# Patient Record
Sex: Male | Born: 1940 | Race: White | Hispanic: No | Smoking: Never smoker
Health system: Southern US, Community
[De-identification: ages and names within clinical notes are randomized; demographics above are authoritative.]

## PROBLEM LIST (undated history)

## (undated) DIAGNOSIS — R9439 Abnormal result of other cardiovascular function study: Secondary | ICD-10-CM

## (undated) DIAGNOSIS — M1990 Unspecified osteoarthritis, unspecified site: Secondary | ICD-10-CM

## (undated) DIAGNOSIS — Z87442 Personal history of urinary calculi: Secondary | ICD-10-CM

## (undated) DIAGNOSIS — I739 Peripheral vascular disease, unspecified: Secondary | ICD-10-CM

## (undated) HISTORY — DX: Abnormal result of other cardiovascular function study: R94.39

## (undated) HISTORY — PX: NECK SURGERY: SHX720

## (undated) HISTORY — PX: CARDIAC CATHETERIZATION: SHX172

---

## 1997-12-25 ENCOUNTER — Ambulatory Visit: Admission: RE | Admit: 1997-12-25 | Discharge: 1997-12-25 | Payer: Self-pay | Admitting: Neurosurgery

## 1998-03-08 ENCOUNTER — Ambulatory Visit (HOSPITAL_COMMUNITY): Admission: RE | Admit: 1998-03-08 | Discharge: 1998-03-08 | Payer: Self-pay | Admitting: Neurosurgery

## 1998-04-25 ENCOUNTER — Ambulatory Visit (HOSPITAL_COMMUNITY): Admission: RE | Admit: 1998-04-25 | Discharge: 1998-04-25 | Payer: Self-pay | Admitting: Gastroenterology

## 2001-06-01 ENCOUNTER — Encounter: Payer: Self-pay | Admitting: Neurosurgery

## 2001-06-01 ENCOUNTER — Inpatient Hospital Stay (HOSPITAL_COMMUNITY): Admission: RE | Admit: 2001-06-01 | Discharge: 2001-06-02 | Payer: Self-pay | Admitting: Neurosurgery

## 2002-07-25 ENCOUNTER — Encounter: Payer: Self-pay | Admitting: Neurosurgery

## 2002-07-25 ENCOUNTER — Ambulatory Visit (HOSPITAL_COMMUNITY): Admission: RE | Admit: 2002-07-25 | Discharge: 2002-07-26 | Payer: Self-pay | Admitting: Neurosurgery

## 2003-02-09 ENCOUNTER — Encounter: Admission: RE | Admit: 2003-02-09 | Discharge: 2003-02-09 | Payer: Self-pay | Admitting: Neurosurgery

## 2003-02-09 ENCOUNTER — Encounter: Payer: Self-pay | Admitting: Neurosurgery

## 2003-02-09 ENCOUNTER — Encounter: Payer: Self-pay | Admitting: Radiology

## 2003-02-23 ENCOUNTER — Encounter: Admission: RE | Admit: 2003-02-23 | Discharge: 2003-02-23 | Payer: Self-pay | Admitting: Neurosurgery

## 2003-02-23 ENCOUNTER — Encounter: Payer: Self-pay | Admitting: Neurosurgery

## 2003-03-01 ENCOUNTER — Encounter: Payer: Self-pay | Admitting: Neurosurgery

## 2003-03-01 ENCOUNTER — Inpatient Hospital Stay (HOSPITAL_COMMUNITY): Admission: RE | Admit: 2003-03-01 | Discharge: 2003-03-03 | Payer: Self-pay | Admitting: Neurosurgery

## 2003-04-06 ENCOUNTER — Encounter: Admission: RE | Admit: 2003-04-06 | Discharge: 2003-04-06 | Payer: Self-pay | Admitting: Neurosurgery

## 2003-04-06 ENCOUNTER — Encounter: Payer: Self-pay | Admitting: Neurosurgery

## 2003-06-09 ENCOUNTER — Encounter: Payer: Self-pay | Admitting: Neurosurgery

## 2003-06-09 ENCOUNTER — Encounter: Admission: RE | Admit: 2003-06-09 | Discharge: 2003-06-09 | Payer: Self-pay | Admitting: Neurosurgery

## 2003-07-31 ENCOUNTER — Emergency Department (HOSPITAL_COMMUNITY): Admission: EM | Admit: 2003-07-31 | Discharge: 2003-07-31 | Payer: Self-pay | Admitting: Emergency Medicine

## 2003-09-01 ENCOUNTER — Ambulatory Visit (HOSPITAL_COMMUNITY): Admission: RE | Admit: 2003-09-01 | Discharge: 2003-09-01 | Payer: Self-pay | Admitting: Gastroenterology

## 2003-11-14 ENCOUNTER — Ambulatory Visit (HOSPITAL_COMMUNITY): Admission: RE | Admit: 2003-11-14 | Discharge: 2003-11-14 | Payer: Self-pay | Admitting: Orthopaedic Surgery

## 2003-11-14 ENCOUNTER — Ambulatory Visit (HOSPITAL_BASED_OUTPATIENT_CLINIC_OR_DEPARTMENT_OTHER): Admission: RE | Admit: 2003-11-14 | Discharge: 2003-11-14 | Payer: Self-pay | Admitting: Orthopaedic Surgery

## 2008-06-21 ENCOUNTER — Encounter: Admission: RE | Admit: 2008-06-21 | Discharge: 2008-06-21 | Payer: Self-pay | Admitting: Family Medicine

## 2008-08-01 ENCOUNTER — Encounter: Admission: RE | Admit: 2008-08-01 | Discharge: 2008-08-01 | Payer: Self-pay | Admitting: Interventional Radiology

## 2008-08-08 ENCOUNTER — Encounter: Admission: RE | Admit: 2008-08-08 | Discharge: 2008-08-08 | Payer: Self-pay | Admitting: Interventional Radiology

## 2008-08-29 ENCOUNTER — Encounter: Admission: RE | Admit: 2008-08-29 | Discharge: 2008-08-29 | Payer: Self-pay | Admitting: Interventional Radiology

## 2009-01-24 ENCOUNTER — Encounter: Admission: RE | Admit: 2009-01-24 | Discharge: 2009-01-24 | Payer: Self-pay | Admitting: Interventional Radiology

## 2010-04-29 IMAGING — US IR TRANSCATH EMBOLIZATION NON-NEURO EA OP FIELD
1 series · 6 of 6 positions shown · non-contrast
Comparison: none

CLINICAL DATA: RT LEG EVLT OF SSV AND POSSIBLE SCLERO. .

[Series 1: ir transcath embolization non-neuro ea op field · 0.06mm/px · 6 acquisitions, 6 frames shown]
[im 1/6]
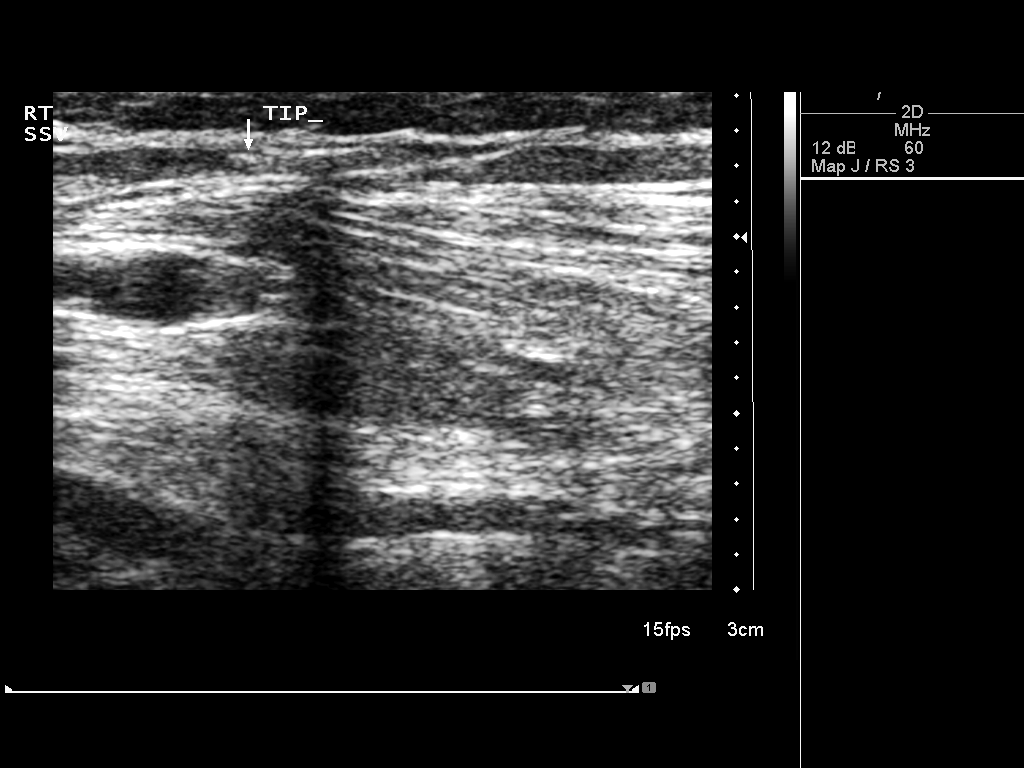
[im 2/6]
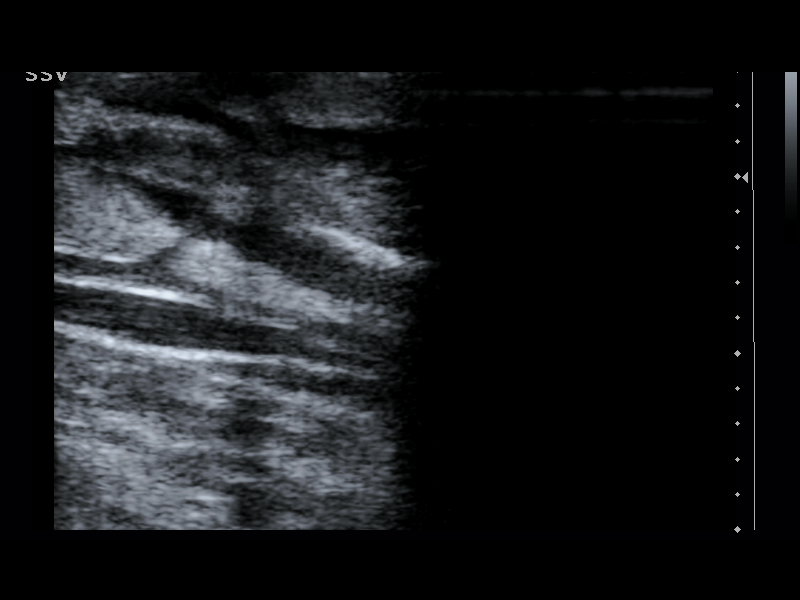
[im 3/6]
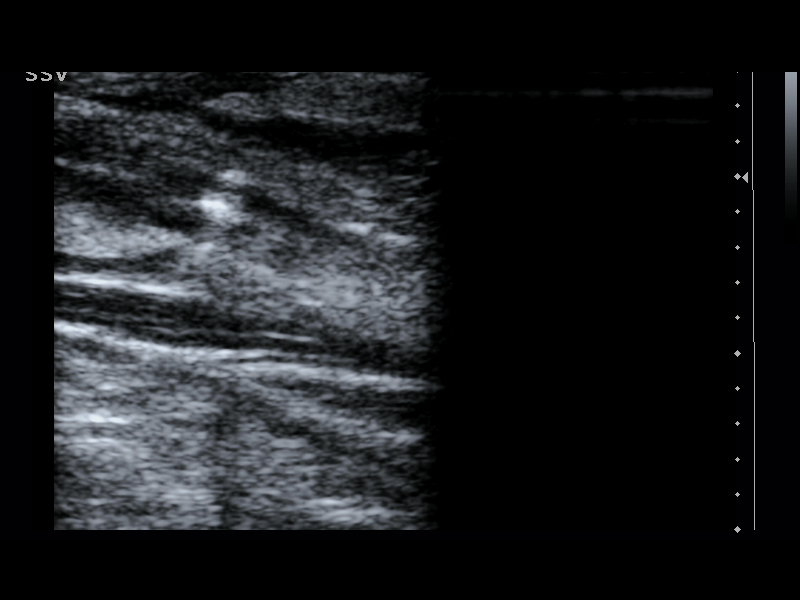
[im 4/6]
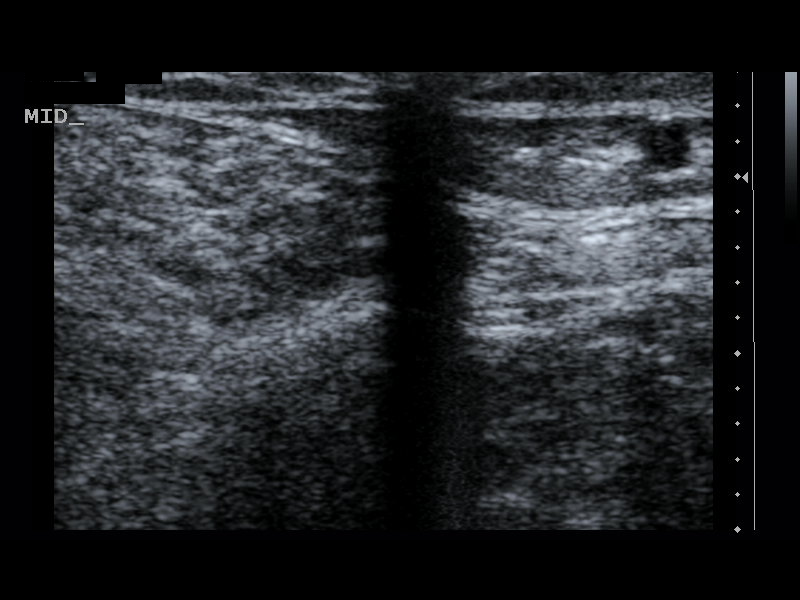
[im 5/6]
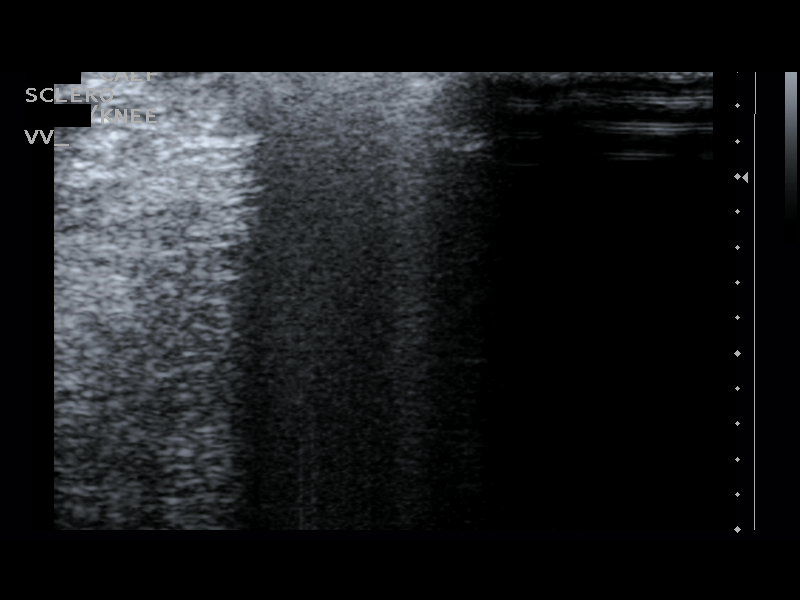
[im 6/6]
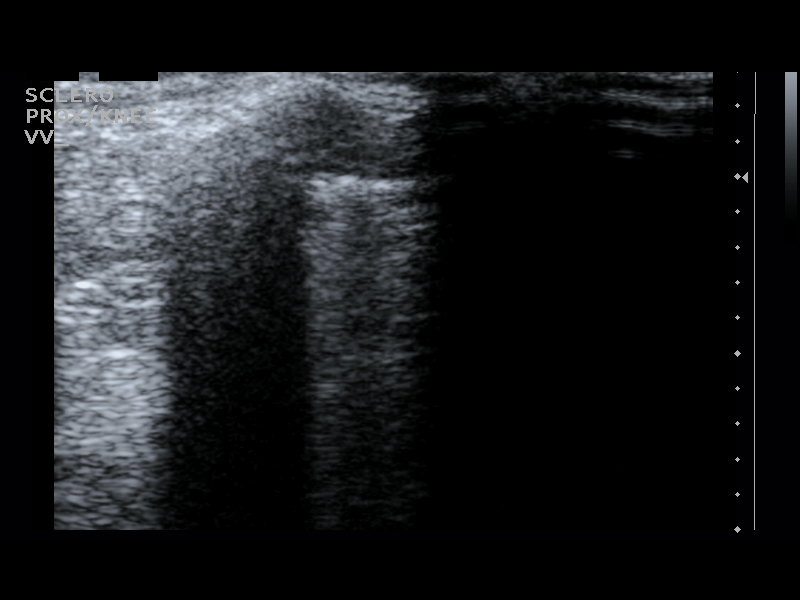

[6 of 6 positions shown; findings below may reference images not displayed]

TRANSCATHETER ENDOVENOUS LASER OCCLUSION OF THE STIR SAPHENOUS VEIN
UNDER ULTRASOUND GUIDANCE

Sedation: Valium 5 mg orally.

Procedure time: 60 minutes.

Tumescent: 100 ml

Laser: 600 micron, 10 Valter power

Access: Right calf

Catheter tip: Saphenopopliteal junction

Length of segment treated: 8 cm

Pull back time: 41 seconds

Total energy: 374 joules

Procedure:The procedure, risks, benefits, and alternatives were
explained to the patient. Questions regarding the procedure were
encouraged and answered. The patient understands and consents to
the procedure.

Initial mapping of the right lower extremity was performed and
course of the great saphenous vein mapped including level of the
saphenofemoral junction and major tributaries supplying
varicosities.

The right calf was prepped withbetadine in a sterile fasion, and a
sterile split drape was applied covering the operative field. A
sterile gown and sterile gloves were used for the procedure.

Under sonographic guidance, a micropuncture needle was inserted
into the right lesser saphenous vein at the level of mid calf,
which was noted to be patent.  It was removed over and 018 wire
which was upsized to an 035 J- wire utilizing the micropuncture
set. This was advanced across the saphenopopliteal junction into
the deep venous system.  A 4-French sheath was inserted over the
wire to the saphenofemoral junction.  The [employer] 600 micron
laser was inserted into the sheath, and was positioned with its tip
2.0 cm from the saphenofemoral junction.

Tumescent anesthesia was instilled along the lesser saphenous vein
providing a 1 cm buffer.  Prior to laser occlusion, distance from
the tip of the laser fiber to the saphenofemoral junction was
remeasured. Laser mediated occlusion was then performed. The sheath
was then removed. A compressive dressing was applied.  A 20-30 mm
Hg graded compression stocking was immediately applied post
procedure.  The patient ambulated prior to discharge.]
FINDINGS: Prior to laser treatment, the laser fiber distance from
the saphenopopliteal junction was documented to be 2 cm.

Complications: None
IMPRESSION: Successful endovenous laser occlusion of the lesser saphenous vein
to treat symptomatic venous insufficiency.

## 2010-05-06 IMAGING — US US EXTREM LOW VENOUS*R*
1 series · 13 of 24 positions shown · non-contrast
Comparison: 06/21/2008

CLINICAL DATA: 1 week status post right lesser saphenous vein
occlusion



[Series 1: us extrem low venous*right* · 13 of 28 slices shown]
[im 1/28]
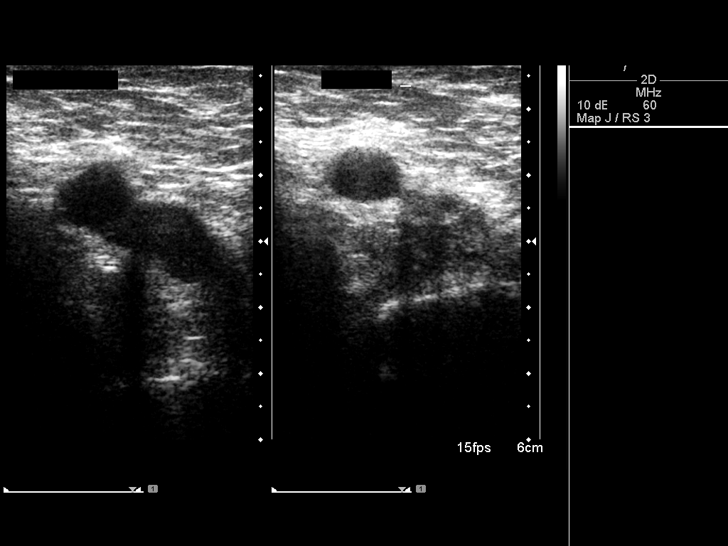
[im 3/28]
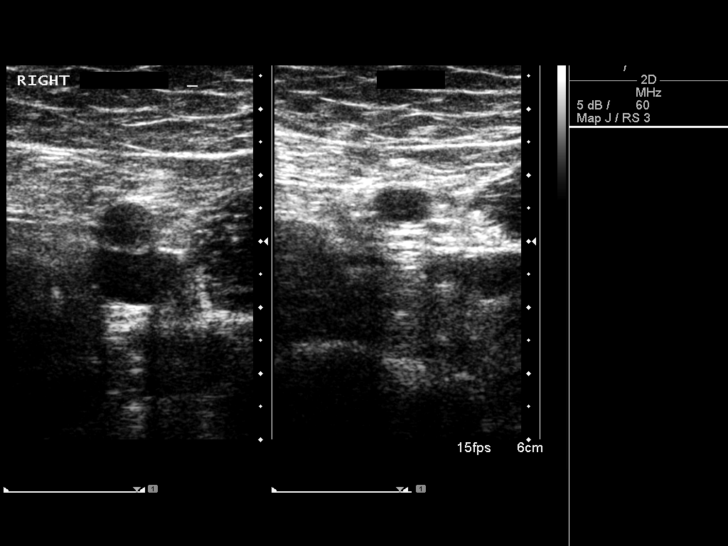
[im 5/28]
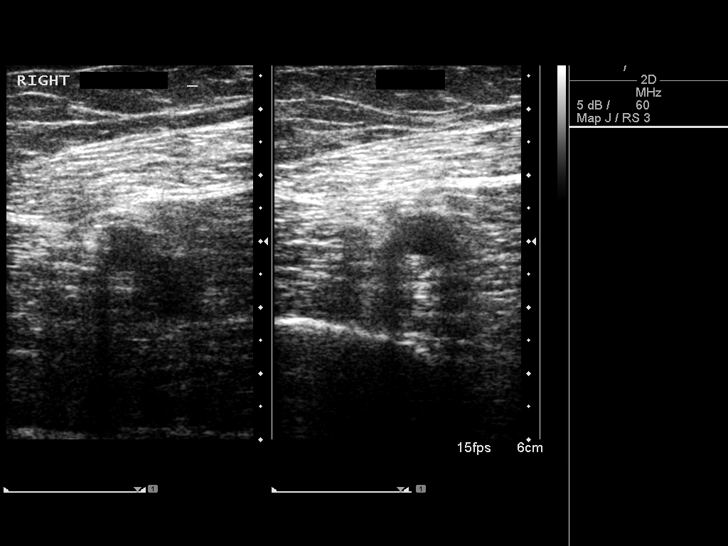
[im 8/28]
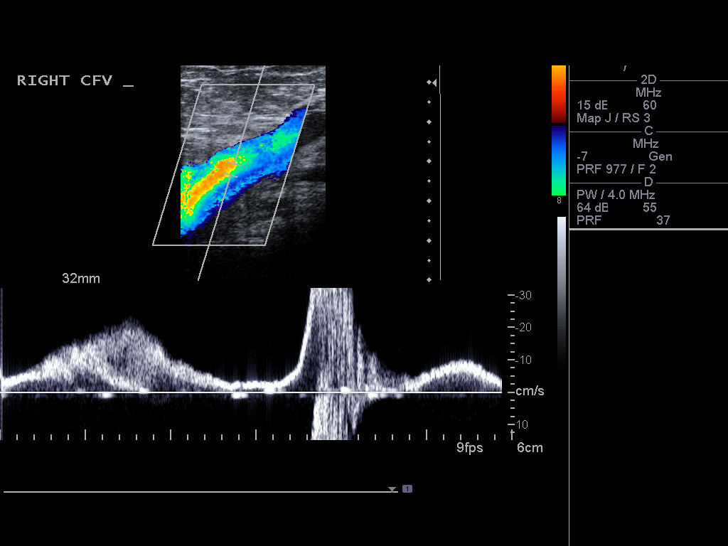
[im 10/28]
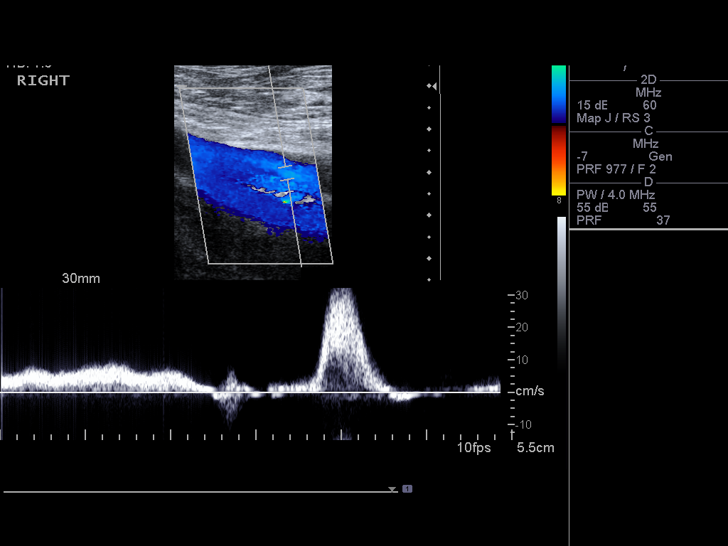
[im 12/28]
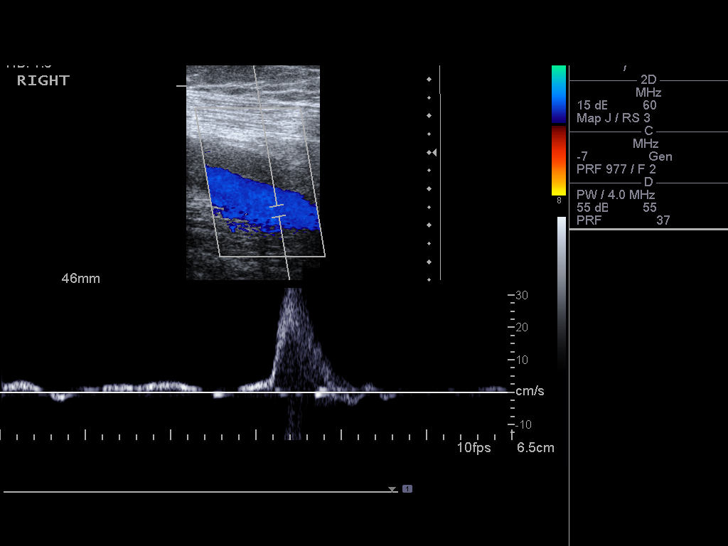
[im 15/28]
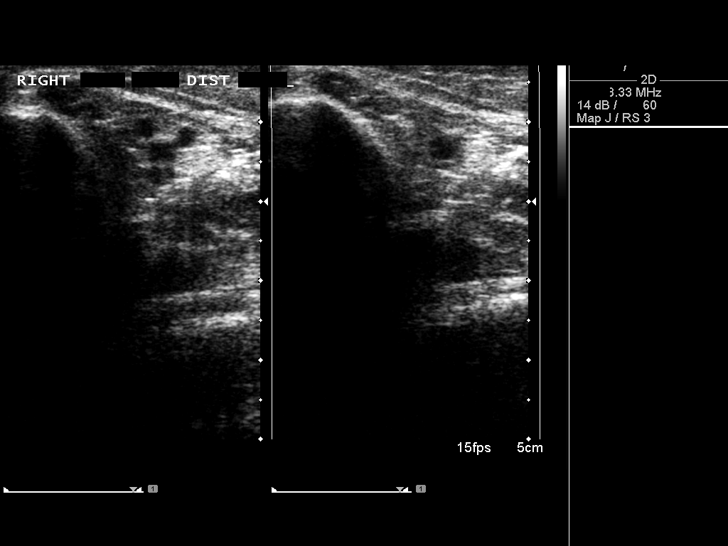
[im 16/28]
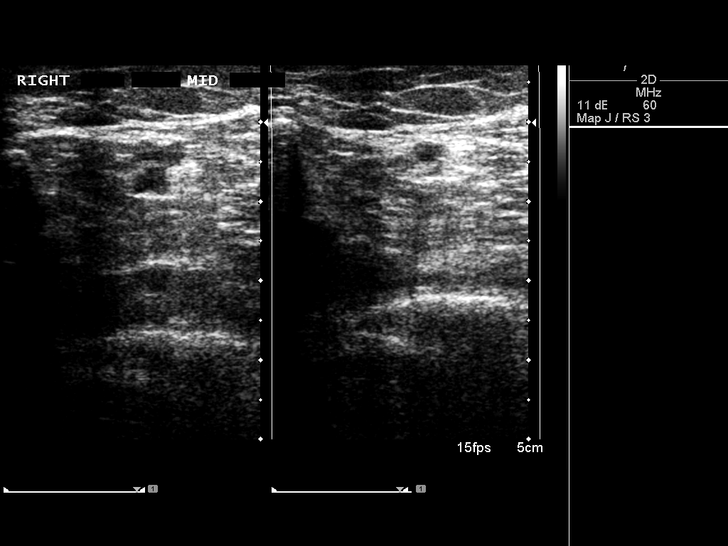
[im 18/28]
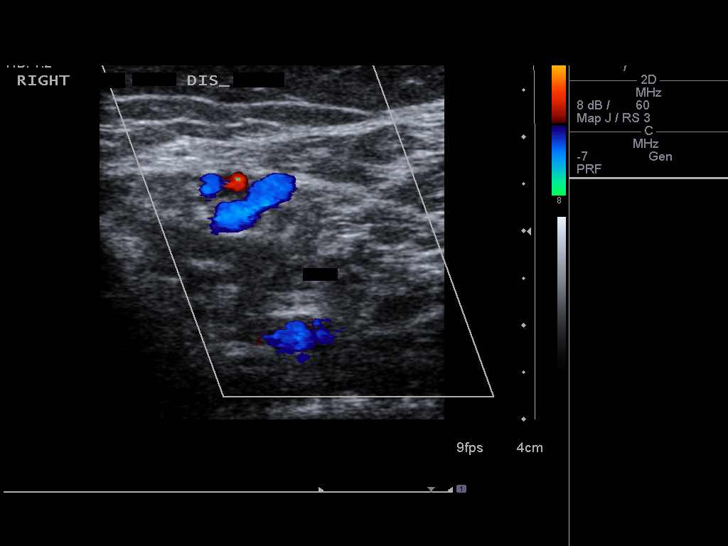
[im 20/28]
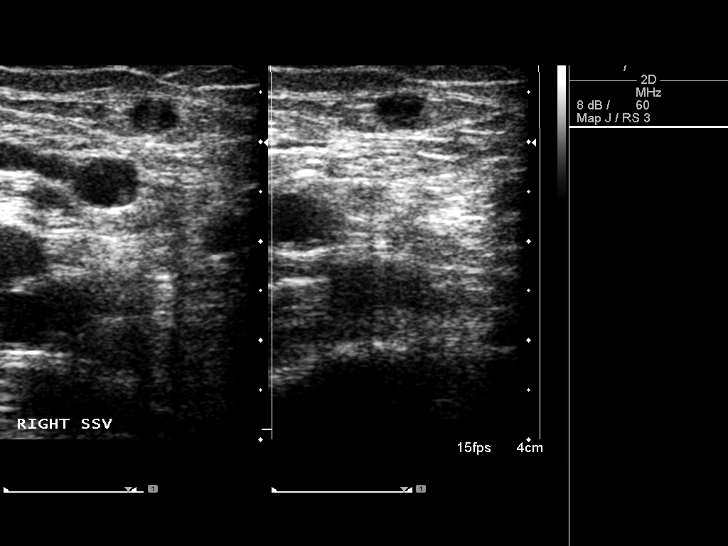
[im 23/28]
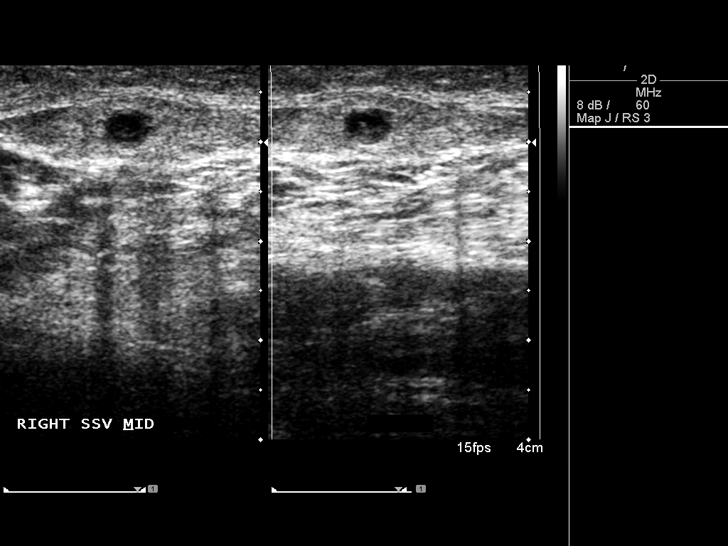
[im 25/28]
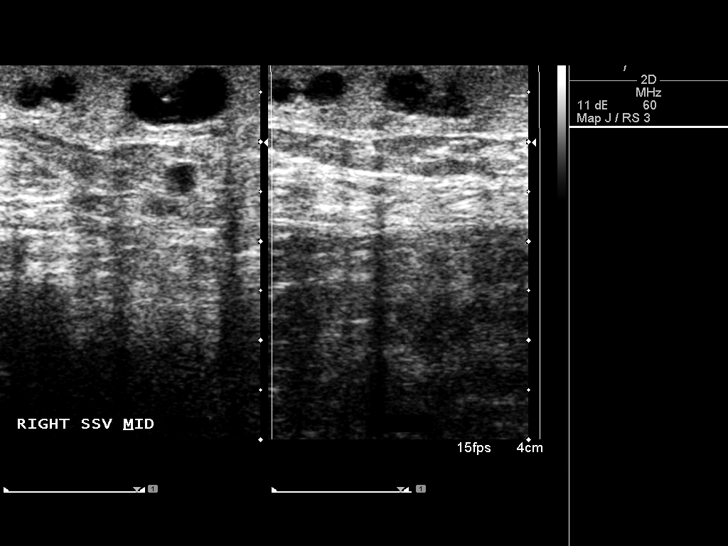
[im 28/28]
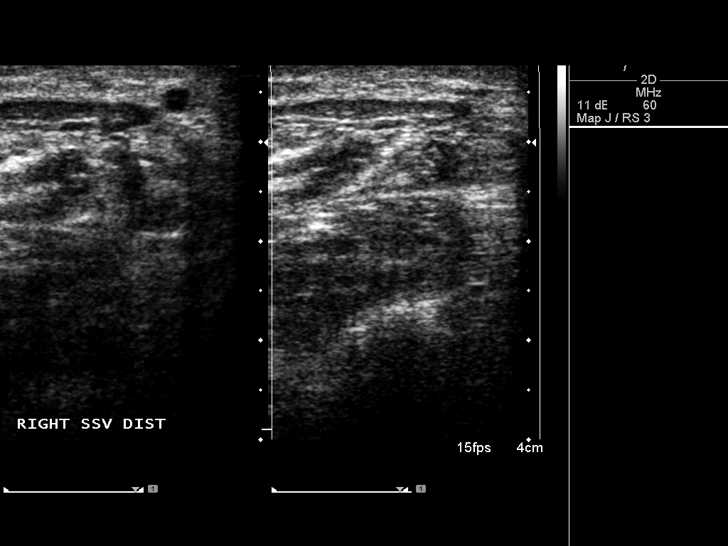

[13 of 24 positions shown; findings below may reference images not displayed]

FINDINGS: There is complete compressibility of the right common
femoral, femoral, and popliteal veins.  Doppler analysis
demonstrates respiratory phasicity and augmentation of flow upon
calf compression.

The right lesser saphenous vein is occluded.  The most inferior
varicosities has also thrombosed.  There is a more superior
varicosities leading to a perforator.  The varicosities partially
thrombosed.  The perforator is partially thrombosed.
IMPRESSION: No DVT.

Successful occlusion of the lesser saphenous vein.

There is partial occlusion of one of the varicosities leading to a
perforator.  The patient was instructed to continue light activity
for 3 weeks.  He will return at that time for reevaluation.

## 2011-02-14 NOTE — Op Note (Signed)
NAME:  Vincent Mcbride, Vincent Mcbride                         ACCOUNT NO.:  [id number]   MEDICAL RECORD NO.:  [id number]                   PATIENT TYPE:  INP   LOCATION:  NA                                   FACILITY:  [facility]   PHYSICIAN:  Henry A. Pool, M.D.                 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   OPERATION PERFORMED:  Re-exploration of L5-S1 laminotomy, bilateral with  bilateral redo microdiskectomies.  L5-S1 posterior lumbar interbody fusion  utilizing tangent wedges and local autograft.  L5-S1 posterolateral fusion  utilizing pedicle screw instrumentation and local autograft.   SURGEON:  Kathaleen Maser. Pool, M.D.   ASSISTANT:  Donalee Citrin, M.D.   ANESTHESIA:  General endotracheal.   INDICATIONS FOR PROCEDURE:  Mr. Mczeal is a 70 year old male who is status  post two previous right-sided L5-S1 laminotomies and microdiskectomies.  The  patient has persistent right lower extremity pain and associated back pain  which has failed conservative management.  MRI scanning demonstrates  evidence of a recurrent/residual right-sided L5-S1 disk herniation with  compression of the right-sided S1 nerve root.  We have discussed options  available for management.  I have recommended that he undergo re-exploration  of his laminotomy with bilateral redo microdiskectomies followed by  posterior lumbar interbody fusion with tangent wedges and local autograft  couplet with posterolateral fusion utilizing pedicle screw fixation and  local autograft.  The patient is aware of the risks and benefits and wishes  to proceed.   DESCRIPTION OF PROCEDURE:  The patient was taken to the operating room and  placed on the table  in the supine position.  After adequate level of  anesthesia was achieved, the patient was positioned prone onto a Wilson  frame and appropriately padded.  The patient's lumbar region was prepped and  draped sterilely.  A 10 blade was used to make a linear skin incision  overlying the L4, L5 and S1 levels.  This was carried down sharply in the  midline.  A subperiosteal dissection was then performed exposing the lamina  and facet joints of L4, L5 and S1 as well as transverse processes of L5 and  the sacral ala.  Deep self-retaining retractor was placed.  Intraoperative  fluoroscopy was used, the level was confirmed.  The patient's previous  laminotomy site on the right at L5-S1 was dissected free with dental  instruments.  A complete laminectomy of L5 was then performed using Leksell  rongeurs, Kerrison rongeurs and a high speed drill to remove the entire  lamina at L5 and the entire inferior facet complex at L5 bilaterally.  Superior facetectomies at L1 were then performed bilaterally.  Ligamentum  flavum was then elevated and resected in piecemeal fashion using Kerrison  rongeurs.  The patient's epidural scar was mobilized and resected.  The  exiting L5 and S1 nerve roots were identified.  Wide foraminotomies were  then performed along the course of the exiting nerve roots.  Epidural scar  along the lateral wall was then dissected free using dental instruments.  A  moderate amount of recurrent disk herniation was encountered just beneath  the right-sided S1 nerve root. This was resected completely.  The thecal sac  and S1 nerve root were then mobilized and retracted towards the midline  using a D'Errico nerve root retractor.  The disk space was then isolated and  incised with a 15 blade in rectangular fashion.  A wide disk space clean out  was then achieved pituitary rongeurs and upward angled pituitary rongeurs  and Epstein curets.  All loose or obviously degenerated disk material was   removed from the interspace. The disk space was then distracted up to 10 mm.  Attention was then placed to the patient's left side. The thecal sac and  nerve roots were protected on the left.  The disk space was once again  incised with a 15 blade and an aggressive diskectomy was performed.  With a  10 mm distractor left in the patient's right side and the thecal sac and  nerve root protected on the left, the disk space was then reamed with a 10  mm box cutter and then cut with a 10 mm tangent chisel. Soft tissues were  removed from the interspace and a 10 x 26 mm tangent wedge was then impacted  into place and recessed approximately 1 mm from the posterior cortical  margin.  The distractor was removed from the patient's right side.  Thecal  sac and nerve roots were then protected on the right.  Disk space was then  reamed and then cut with a 10 mm tangent chisel.  Soft tissue was removed  from the interspace.  The disk space was further curettaged.  Morselized  autograft was then packed into the interspace.  A second 10 x 26 mm tangent  wedge was then packed into place and recessed approximately 1 mm from the  posterior cortical margin.  Pedicles at L5 and S1 were then isolated using  surface landmarks and intraoperative fluoroscopy.  Each pedicle was then  entered by first removing superficial bone using high speed drill.  Each  pedicle was then probed using a pedicle awl.  Each pedicle awl track was  found to be solidly within bone.  Each pedicle awl track was then tapped  with a 5.25 mm screw tap.  Each screw tap hole was found to be solidly  within bone.  A 6.75 x 45 mm spiral 90 screw was then placed bilaterally at  L5.  A 6.75 x 40 mm screws were placed bilaterally at S1.  Transverse  processes and sacral ala were then decorticated using a high speed drill.  The wound was then irrigated with antibiotic solution.  Morselized autograft was then packed posterolaterally for later fusion.   A short segment of  titanium rod was then contoured and placed over the screw heads at L5 and  S1.  Locking caps were then placed over the screw heads. These caps were  then engaged in sequential fashion with the construct under compression.  Final images revealed good position of bone graft and hardware at the proper  operative level with normal  alignment of the spine.  The wound was then  irrigated one final time.  Gelfoam was placed topically for hemostasis which  was found to be good.  A medium Hemovac drain was left in the epidural  space.  The wound was then closed in layers with Vicryl sutures.  Steri-  Strips and sterile dressing were applied.  There were no apparent  complications.  The patient tolerated the procedure well and returned to the  recovery room postoperatively.                                               Henry A. Pool, M.D.    HAP/MEDQ  D:  03/01/2003  T:  03/01/2003  Job:  [id number]

## 2011-02-14 NOTE — Op Note (Signed)
NAME:  Vincent Mcbride, Vincent Mcbride                         ACCOUNT NO.:  [id number]   MEDICAL RECORD NO.:  [id number]                   PATIENT TYPE:  OIB   LOCATION:  2867                                 FACILITY:  [facility]   PHYSICIAN:  Hewitt Shorts, M.D.            DATE OF BIRTH:  [birth year]   DATE OF PROCEDURE:  07/25/2002  DATE OF DISCHARGE:                                 OPERATIVE REPORT   PREOPERATIVE DIAGNOSIS:  Recurrent right L5-S1 lumbar disc herniation.   POSTOPERATIVE DIAGNOSIS:  Recurrent right L5-S1 lumbar disc herniation.   OPERATION:  Right L5-S1 lumbar laminotomy and microdiskectomy.   SURGEON:  Hewitt Shorts, M.D.   ASSISTANT:  Eber Hong, P.A.   ANESTHESIA:  General endotracheal.   INDICATIONS FOR PROCEDURE:  The patient is a 70 year old man who presented  with right lumbar radiculopathy.  He is about 14 months status post a  previous right L5-S1 lumbar laminotomy and microdiskectomy.  MRI revealed  recurrent disc herniation.  Decision to proceed with elective laminotomy and  microdiskectomy.   DESCRIPTION OF PROCEDURE:  The patient was brought to the operating room and  placed under general endotracheal anesthesia.  The patient was turned to a  prone position.  Lumbar region was prepped with Betadine soap and solution  and draped in a sterile fashion.  A midline incision was treated with local  anesthetic with epinephrine and an x-ray was taken to localize the L5-S1  level.  A midline incision was made and carried down through the  subcutaneous tissue.  Bipolar cautery and electrocautery used to maintain  hemostasis.  Dissection was carried down to the lumbar fascia which was  incised on the right side of the midline and the paraspinal muscles were  dissected from the spinous process and lamina in a subperiosteal fashion.  There was moderate scarring from his previous surgery.  The microscope was  draped and brought into the field to provide  instrument magnification,  illumination and visualization and the remainder of the procedure was  performed using microdissection and microsurgical technique.  The scar  tissue within the previous laminotomy device was carefully dissected.  An x-  ray was taken to confirm our localization at the L5-S1 level and then the  laminotomy was extended both rostrally and caudally using the [facility] Max  drill and Kerrison punches.  We were able to identify the thecal sac and  right S1 nerve root and gradually we were able to mobilize these structures  medially.  The annulus of the disc was identified and the annulus was  incised.  We entered the disc space and proceeded with diskectomy using a  variety of pituitary rongeurs.  We then examined the area beneath the right  S1 nerve root that was somewhat scarred down.  Several fragments of disc  herniation free within the scar tissue and directing compressing the right  S1 nerve root were identified  and mobilized and removed.  We were able to  free up the right S1 nerve root and it was no longer compressed.  We  proceeded with diskectomy using variety of pituitary rongeurs removing all  loose fragments of disc material from both the disc space and epidural space  and once the diskectomy was completed and all loose fragments of disc  material were removed, we established hemostasis with the use of bipolar  cautery and then we inserted 2 cc of Fentanyl and 80 mg of Depo-Medrol into  the epidural space and then proceeded with closure.  The deep fascia was  closed with interrupted undyed 0 Vicryl sutures, the subcutaneous was closed  with interrupted inverted 2-0 undyed Vicryl suture.  The skin was  reapproximated with Dermabond.  The patient tolerated the  procedure well.  The estimated blood loss was 10 cc.  Sponge and needle  count was correct.  Following surgery, the patient was turned back to a  supine position, anesthesia reversed, extubated and  transferred to the  recovery room for further care.                                               Hewitt Shorts, M.D.    RWN/MEDQ  D:  07/25/2002  T:  07/25/2002  Job:  [id number]
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
## 2011-02-14 NOTE — Op Note (Signed)
[facility]  Patient:    Vincent Mcbride, Vincent Mcbride Visit Number: [id number] MRN: [id number]          Service Type: SUR Location: [id number] Attending Physician:  Barton Fanny Dictated by:   Hewitt Shorts, M.D. Proc. Date: 06/01/01 Admit Date:  06/01/2001                             Operative Report  PREOPERATIVE DIAGNOSIS:  Right L5-S1 lumbar disk herniation.  POSTOPERATIVE DIAGNOSIS:  Right L5-S1 lumbar disk herniation.  OPERATION PERFORMED:  Right L5-S1 lumbar laminotomy and microendoscopic diskectomy.  SURGEON:  Hewitt Shorts, M.D.  ASSISTANT:  Tanya Nones. Jeral Fruit, M.D.  ANESTHESIA:  General endotracheal.  INDICATIONS FOR PROCEDURE:  The patient is a 70 year old man who presented with a cute right lumbar radiculopathy and was found to have a right L5-S1 lumbar disk herniation that migrated caudally behind the body of S1.  Decision was made to proceed with elective laminotomy and microendoscopic diskectomy.  DESCRIPTION OF PROCEDURE:  The patient was brought to the operating room and placed under general endotracheal anesthesia.  The patient was turned to a prone position.  Lumbar region was prepped with Betadine soap and solution and draped in a sterile fashion.  Using C-arm fluoroscopy we identified the skin surface over the L5-S1 interlaminar space.  An incision was made proximally 2.5 cm to the right of the midline.  The line of the incision was infiltrated with local anesthetic with epinephrine.  A k-wire was passed down to the S1 lamina and then a series of dilators was passed over the K-wire.  We were able to dissect the superficial tissues overlying the lamina and interlaminar space and then a 6 cm tube retractor was placed and locked in position.  The microscope was draped and brought into the field to provide additional magnification, illumination and visualization.  The remainder of the procedure was performed  using microdissection and microsurgical technique.  Superficial soft tissue on the laminar surface was removed and the interlaminar space identified.  Superior aspect of S1 was removed using the [employer] drill and Kerrison punches and a medial facetectomy was performed and the ligamentum flavum was removed.  We identified the thecal sac and right S1 nerve root.  We attempted to retract the nerve root medially but it was difficult to do so. We therefore extended the S1 laminotomy medially and exposed the axilla of the nerve root.  Within the axilla was found a large disk fragment.  This was removed in a piecemeal fashion.  Portions of the disk were adherent to the nerve root.  We removed the mass and the great majority of the disk material but that portion that was directly adherent to the nerve root and could not be dissected away without straining and stretching the nerve root was left; however, the nerve root was clearly decompressed.  We did not enter the disk space.  All loose fragments of disk material that were loose within the epidural space were removed and the nerve root was decompressed as was the thecal sac.  We irrigated the wound with bacitracin solution, checked for hemostasis which was established with the use of bipolar cautery as well as Gelfoam soaked in thrombin.  All the Gelfoam was removed prior to closure. Once hemostasis was established, we instilled 2 cc of fentanyl and 80 mg of Depo-Medrol into the epidural space and then proceeded with  closure.  The subcutaneous and subcuticular layer were closed with interrupted inverted 3-0 undyed Vicryl sutures and the skin was closed with Dermabond.  The patient tolerated the procedure well.  Estimated blood loss for this procedure was less than 25 cc.  Sponge, needle and instrument counts were correct. Following surgery, the patient was turned back to supine position to be reversed from anesthetic, extubated and transferred to  recovery for further care. Dictated by:   Hewitt Shorts, M.D. Attending Physician:  Barton Fanny DD:  06/01/01 TD:  06/01/01 Job: [id number]

## 2011-02-14 NOTE — Op Note (Signed)
NAME:  Vincent Mcbride, Vincent Mcbride                         ACCOUNT NO.:  [id number]   MEDICAL RECORD NO.:  [id number]                   PATIENT TYPE:  AMB   LOCATION:  ENDO                                 FACILITY:  [facility]   PHYSICIAN:  Bernette Redbird, M.D.                DATE OF BIRTH:  1941/06/03   DATE OF PROCEDURE:  09/01/2003  DATE OF DISCHARGE:                                 OPERATIVE REPORT   PROCEDURE:  Colonoscopy.   INDICATION:  Followup of previous small colonic adenoma with negative  colonoscopy for surveillance 5 years ago.   FINDINGS:  Sigmoid diverticulosis.   PROCEDURE:  The nature, purpose, and risks of the procedure had been  discussed with the patient who provided written consent.   The digital exam of the prostate was unremarkable.  Sedation was Fentanyl 40  mcg and Versed 4 mg without arrhythmias or desaturation.   The Olympus adult video-colonoscope was advanced to the terminal ileum  without difficulty using a little bit of external abdominal compression and  controlled looping.  The terminal ileum had a normal appearance and pullback  was then performed.   The quality of the prep was excellent and it was felt that all areas were  well seen.   There was moderate sigmoid diverticulosis, but this was otherwise a normal  examine without evidence of polyps, cancer, vascular malformations, or  colitis.  Retroflexion in the rectum was unremarkable.  No biopsies were  obtained.  The patient tolerated the procedure well and there were no  apparent complications.   IMPRESSION:  Sigmoid diverticulosis, otherwise normal surveillance  colonoscopy.   PLAN:  Followup colonoscopy in 5 years.                                               Bernette Redbird, M.D.    RB/MEDQ  D:  09/01/2003  T:  09/01/2003  Job:  [id number]   cc:   Al Decant. Janey Greaser, MD  [address]  [place]  [place] [address]  Fax: [phone]

## 2011-12-31 DIAGNOSIS — M2559 Pain in other specified joint: Secondary | ICD-10-CM | POA: Diagnosis not present

## 2011-12-31 DIAGNOSIS — M999 Biomechanical lesion, unspecified: Secondary | ICD-10-CM | POA: Diagnosis not present

## 2012-02-24 DIAGNOSIS — N400 Enlarged prostate without lower urinary tract symptoms: Secondary | ICD-10-CM | POA: Diagnosis not present

## 2012-03-01 DIAGNOSIS — N419 Inflammatory disease of prostate, unspecified: Secondary | ICD-10-CM | POA: Diagnosis not present

## 2012-03-01 DIAGNOSIS — N200 Calculus of kidney: Secondary | ICD-10-CM | POA: Diagnosis not present

## 2012-03-01 DIAGNOSIS — N402 Nodular prostate without lower urinary tract symptoms: Secondary | ICD-10-CM | POA: Diagnosis not present

## 2012-03-01 DIAGNOSIS — N401 Enlarged prostate with lower urinary tract symptoms: Secondary | ICD-10-CM | POA: Diagnosis not present

## 2012-03-01 DIAGNOSIS — N138 Other obstructive and reflux uropathy: Secondary | ICD-10-CM | POA: Diagnosis not present

## 2012-04-26 DIAGNOSIS — M999 Biomechanical lesion, unspecified: Secondary | ICD-10-CM | POA: Diagnosis not present

## 2012-04-26 DIAGNOSIS — M2559 Pain in other specified joint: Secondary | ICD-10-CM | POA: Diagnosis not present

## 2012-05-19 DIAGNOSIS — T1490XA Injury, unspecified, initial encounter: Secondary | ICD-10-CM | POA: Diagnosis not present

## 2012-05-19 DIAGNOSIS — R071 Chest pain on breathing: Secondary | ICD-10-CM | POA: Diagnosis not present

## 2012-07-28 DIAGNOSIS — Z23 Encounter for immunization: Secondary | ICD-10-CM | POA: Diagnosis not present

## 2012-08-02 DIAGNOSIS — L821 Other seborrheic keratosis: Secondary | ICD-10-CM | POA: Diagnosis not present

## 2012-08-02 DIAGNOSIS — D235 Other benign neoplasm of skin of trunk: Secondary | ICD-10-CM | POA: Diagnosis not present

## 2012-08-02 DIAGNOSIS — D485 Neoplasm of uncertain behavior of skin: Secondary | ICD-10-CM | POA: Diagnosis not present

## 2012-08-02 DIAGNOSIS — D1801 Hemangioma of skin and subcutaneous tissue: Secondary | ICD-10-CM | POA: Diagnosis not present

## 2012-08-31 DIAGNOSIS — Z131 Encounter for screening for diabetes mellitus: Secondary | ICD-10-CM | POA: Diagnosis not present

## 2012-08-31 DIAGNOSIS — Z136 Encounter for screening for cardiovascular disorders: Secondary | ICD-10-CM | POA: Diagnosis not present

## 2012-08-31 DIAGNOSIS — Z0000 Encounter for general adult medical examination without abnormal findings: Secondary | ICD-10-CM | POA: Diagnosis not present

## 2012-08-31 DIAGNOSIS — N529 Male erectile dysfunction, unspecified: Secondary | ICD-10-CM | POA: Diagnosis not present

## 2012-08-31 DIAGNOSIS — Z125 Encounter for screening for malignant neoplasm of prostate: Secondary | ICD-10-CM | POA: Diagnosis not present

## 2012-11-02 DIAGNOSIS — L905 Scar conditions and fibrosis of skin: Secondary | ICD-10-CM | POA: Diagnosis not present

## 2013-03-29 DIAGNOSIS — N138 Other obstructive and reflux uropathy: Secondary | ICD-10-CM | POA: Diagnosis not present

## 2013-03-29 DIAGNOSIS — N401 Enlarged prostate with lower urinary tract symptoms: Secondary | ICD-10-CM | POA: Diagnosis not present

## 2013-04-05 DIAGNOSIS — N200 Calculus of kidney: Secondary | ICD-10-CM | POA: Diagnosis not present

## 2013-04-22 DIAGNOSIS — I8000 Phlebitis and thrombophlebitis of superficial vessels of unspecified lower extremity: Secondary | ICD-10-CM | POA: Diagnosis not present

## 2013-04-25 ENCOUNTER — Ambulatory Visit
Admission: RE | Admit: 2013-04-25 | Discharge: 2013-04-25 | Disposition: A | Discharge status: Home / Self Care | Payer: Medicare Other | Source: Ambulatory Visit | Attending: Family Medicine | Admitting: Family Medicine

## 2013-04-25 ENCOUNTER — Other Ambulatory Visit: Payer: Self-pay | Admitting: Family Medicine

## 2013-04-25 DIAGNOSIS — R609 Edema, unspecified: Secondary | ICD-10-CM

## 2013-04-25 DIAGNOSIS — I83893 Varicose veins of bilateral lower extremities with other complications: Secondary | ICD-10-CM | POA: Diagnosis not present

## 2013-05-02 ENCOUNTER — Other Ambulatory Visit: Payer: Self-pay | Admitting: Family Medicine

## 2013-05-02 DIAGNOSIS — I83811 Varicose veins of right lower extremities with pain: Secondary | ICD-10-CM

## 2013-05-04 ENCOUNTER — Ambulatory Visit
Admission: RE | Admit: 2013-05-04 | Discharge: 2013-05-04 | Disposition: A | Discharge status: Home / Self Care | Payer: Medicare Other | Source: Ambulatory Visit | Attending: Family Medicine | Admitting: Family Medicine

## 2013-05-04 DIAGNOSIS — I83811 Varicose veins of right lower extremities with pain: Secondary | ICD-10-CM

## 2013-05-04 DIAGNOSIS — I83893 Varicose veins of bilateral lower extremities with other complications: Secondary | ICD-10-CM | POA: Diagnosis not present

## 2013-05-19 ENCOUNTER — Telehealth: Payer: Self-pay | Admitting: Radiology

## 2013-05-19 NOTE — Telephone Encounter (Signed)
Contacted patient to discuss insurance approval for sclerotherapy.    Patient states that he prefers to schedule Rx for October 2014.  Will contact patient to schedule when October IR Rad schedule has been confirmed.  Delyle Weider Carmell Austria, RN 05/19/2013 2:01 PM

## 2013-06-13 DIAGNOSIS — H43819 Vitreous degeneration, unspecified eye: Secondary | ICD-10-CM | POA: Diagnosis not present

## 2013-06-15 ENCOUNTER — Other Ambulatory Visit (HOSPITAL_COMMUNITY): Payer: Self-pay | Admitting: Interventional Radiology

## 2013-06-15 DIAGNOSIS — M79609 Pain in unspecified limb: Secondary | ICD-10-CM | POA: Diagnosis not present

## 2013-06-15 DIAGNOSIS — I83811 Varicose veins of right lower extremities with pain: Secondary | ICD-10-CM

## 2013-07-05 ENCOUNTER — Other Ambulatory Visit (HOSPITAL_COMMUNITY): Payer: Self-pay | Admitting: Interventional Radiology

## 2013-07-05 ENCOUNTER — Ambulatory Visit
Admission: RE | Admit: 2013-07-05 | Discharge: 2013-07-05 | Disposition: A | Discharge status: Home / Self Care | Payer: Medicare Other | Source: Ambulatory Visit | Attending: Interventional Radiology | Admitting: Interventional Radiology

## 2013-07-05 DIAGNOSIS — I83811 Varicose veins of right lower extremities with pain: Secondary | ICD-10-CM

## 2013-07-05 DIAGNOSIS — M79609 Pain in unspecified limb: Secondary | ICD-10-CM | POA: Diagnosis not present

## 2013-07-19 DIAGNOSIS — Z23 Encounter for immunization: Secondary | ICD-10-CM | POA: Diagnosis not present

## 2013-08-04 DIAGNOSIS — D235 Other benign neoplasm of skin of trunk: Secondary | ICD-10-CM | POA: Diagnosis not present

## 2013-08-04 DIAGNOSIS — L570 Actinic keratosis: Secondary | ICD-10-CM | POA: Diagnosis not present

## 2013-08-04 DIAGNOSIS — D1801 Hemangioma of skin and subcutaneous tissue: Secondary | ICD-10-CM | POA: Diagnosis not present

## 2013-08-04 DIAGNOSIS — E782 Mixed hyperlipidemia: Secondary | ICD-10-CM | POA: Diagnosis not present

## 2013-08-10 ENCOUNTER — Ambulatory Visit: Payer: Medicare Other

## 2013-08-10 ENCOUNTER — Ambulatory Visit
Admission: RE | Admit: 2013-08-10 | Discharge: 2013-08-10 | Disposition: A | Discharge status: Home / Self Care | Payer: Medicare Other | Source: Ambulatory Visit | Attending: Interventional Radiology | Admitting: Interventional Radiology

## 2013-08-10 ENCOUNTER — Other Ambulatory Visit: Payer: Medicare Other

## 2013-08-10 DIAGNOSIS — I83811 Varicose veins of right lower extremities with pain: Secondary | ICD-10-CM

## 2013-08-10 DIAGNOSIS — I8390 Asymptomatic varicose veins of unspecified lower extremity: Secondary | ICD-10-CM | POA: Diagnosis not present

## 2013-09-09 DIAGNOSIS — Z0000 Encounter for general adult medical examination without abnormal findings: Secondary | ICD-10-CM | POA: Diagnosis not present

## 2013-09-09 DIAGNOSIS — Z1331 Encounter for screening for depression: Secondary | ICD-10-CM | POA: Diagnosis not present

## 2013-09-09 DIAGNOSIS — E786 Lipoprotein deficiency: Secondary | ICD-10-CM | POA: Diagnosis not present

## 2013-09-09 DIAGNOSIS — N529 Male erectile dysfunction, unspecified: Secondary | ICD-10-CM | POA: Diagnosis not present

## 2013-09-09 DIAGNOSIS — N200 Calculus of kidney: Secondary | ICD-10-CM | POA: Diagnosis not present

## 2013-09-09 DIAGNOSIS — Z125 Encounter for screening for malignant neoplasm of prostate: Secondary | ICD-10-CM | POA: Diagnosis not present

## 2013-09-09 DIAGNOSIS — Z79899 Other long term (current) drug therapy: Secondary | ICD-10-CM | POA: Diagnosis not present

## 2013-09-16 DIAGNOSIS — Z86010 Personal history of colonic polyps: Secondary | ICD-10-CM | POA: Diagnosis not present

## 2013-09-16 DIAGNOSIS — K5730 Diverticulosis of large intestine without perforation or abscess without bleeding: Secondary | ICD-10-CM | POA: Diagnosis not present

## 2013-09-16 DIAGNOSIS — Z800 Family history of malignant neoplasm of digestive organs: Secondary | ICD-10-CM | POA: Diagnosis not present

## 2013-09-16 DIAGNOSIS — D126 Benign neoplasm of colon, unspecified: Secondary | ICD-10-CM | POA: Diagnosis not present

## 2013-09-16 DIAGNOSIS — Z09 Encounter for follow-up examination after completed treatment for conditions other than malignant neoplasm: Secondary | ICD-10-CM | POA: Diagnosis not present

## 2013-10-21 DIAGNOSIS — Z23 Encounter for immunization: Secondary | ICD-10-CM | POA: Diagnosis not present

## 2013-12-06 DIAGNOSIS — M999 Biomechanical lesion, unspecified: Secondary | ICD-10-CM | POA: Diagnosis not present

## 2013-12-06 DIAGNOSIS — M2559 Pain in other specified joint: Secondary | ICD-10-CM | POA: Diagnosis not present

## 2014-05-22 DIAGNOSIS — R7309 Other abnormal glucose: Secondary | ICD-10-CM | POA: Diagnosis not present

## 2014-05-22 DIAGNOSIS — R059 Cough, unspecified: Secondary | ICD-10-CM | POA: Diagnosis not present

## 2014-05-22 DIAGNOSIS — R05 Cough: Secondary | ICD-10-CM | POA: Diagnosis not present

## 2014-05-22 DIAGNOSIS — J329 Chronic sinusitis, unspecified: Secondary | ICD-10-CM | POA: Diagnosis not present

## 2014-05-30 DIAGNOSIS — N529 Male erectile dysfunction, unspecified: Secondary | ICD-10-CM

## 2014-05-30 DIAGNOSIS — N138 Other obstructive and reflux uropathy: Secondary | ICD-10-CM | POA: Insufficient documentation

## 2014-05-30 DIAGNOSIS — M5137 Other intervertebral disc degeneration, lumbosacral region: Secondary | ICD-10-CM | POA: Insufficient documentation

## 2014-05-30 DIAGNOSIS — N401 Enlarged prostate with lower urinary tract symptoms: Secondary | ICD-10-CM

## 2014-05-30 DIAGNOSIS — R300 Dysuria: Secondary | ICD-10-CM | POA: Insufficient documentation

## 2014-05-30 DIAGNOSIS — M5030 Other cervical disc degeneration, unspecified cervical region: Secondary | ICD-10-CM | POA: Insufficient documentation

## 2014-05-30 DIAGNOSIS — N419 Inflammatory disease of prostate, unspecified: Secondary | ICD-10-CM | POA: Insufficient documentation

## 2014-05-30 DIAGNOSIS — N319 Neuromuscular dysfunction of bladder, unspecified: Secondary | ICD-10-CM | POA: Insufficient documentation

## 2014-05-30 DIAGNOSIS — N402 Nodular prostate without lower urinary tract symptoms: Secondary | ICD-10-CM | POA: Insufficient documentation

## 2014-05-30 DIAGNOSIS — N200 Calculus of kidney: Secondary | ICD-10-CM

## 2014-05-30 HISTORY — DX: Inflammatory disease of prostate, unspecified: N41.9

## 2014-05-30 HISTORY — DX: Dysuria: R30.0

## 2014-05-30 HISTORY — DX: Neuromuscular dysfunction of bladder, unspecified: N31.9

## 2014-05-30 HISTORY — DX: Nodular prostate without lower urinary tract symptoms: N40.2

## 2014-05-30 HISTORY — DX: Calculus of kidney: N20.0

## 2014-05-30 HISTORY — DX: Other obstructive and reflux uropathy: N40.1

## 2014-05-30 HISTORY — DX: Male erectile dysfunction, unspecified: N52.9

## 2014-05-30 HISTORY — DX: Other obstructive and reflux uropathy: N13.8

## 2014-05-30 HISTORY — DX: Other cervical disc degeneration, unspecified cervical region: M50.30

## 2014-06-01 DIAGNOSIS — Z23 Encounter for immunization: Secondary | ICD-10-CM | POA: Diagnosis not present

## 2014-06-16 DIAGNOSIS — N139 Obstructive and reflux uropathy, unspecified: Secondary | ICD-10-CM | POA: Diagnosis not present

## 2014-06-16 DIAGNOSIS — N138 Other obstructive and reflux uropathy: Secondary | ICD-10-CM | POA: Diagnosis not present

## 2014-06-16 DIAGNOSIS — R109 Unspecified abdominal pain: Secondary | ICD-10-CM | POA: Diagnosis not present

## 2014-06-16 DIAGNOSIS — N401 Enlarged prostate with lower urinary tract symptoms: Secondary | ICD-10-CM | POA: Diagnosis not present

## 2014-06-16 DIAGNOSIS — N200 Calculus of kidney: Secondary | ICD-10-CM | POA: Diagnosis not present

## 2014-06-16 DIAGNOSIS — N529 Male erectile dysfunction, unspecified: Secondary | ICD-10-CM | POA: Diagnosis not present

## 2014-07-30 DIAGNOSIS — Z87442 Personal history of urinary calculi: Secondary | ICD-10-CM | POA: Diagnosis not present

## 2014-07-30 DIAGNOSIS — R112 Nausea with vomiting, unspecified: Secondary | ICD-10-CM | POA: Diagnosis not present

## 2014-07-30 DIAGNOSIS — Q631 Lobulated, fused and horseshoe kidney: Secondary | ICD-10-CM | POA: Diagnosis not present

## 2014-07-30 DIAGNOSIS — N200 Calculus of kidney: Secondary | ICD-10-CM | POA: Diagnosis not present

## 2014-07-30 DIAGNOSIS — N201 Calculus of ureter: Secondary | ICD-10-CM | POA: Diagnosis not present

## 2014-07-30 DIAGNOSIS — R109 Unspecified abdominal pain: Secondary | ICD-10-CM | POA: Diagnosis not present

## 2014-07-30 DIAGNOSIS — R1031 Right lower quadrant pain: Secondary | ICD-10-CM | POA: Diagnosis not present

## 2014-07-30 DIAGNOSIS — N132 Hydronephrosis with renal and ureteral calculous obstruction: Secondary | ICD-10-CM | POA: Diagnosis not present

## 2014-07-30 DIAGNOSIS — R110 Nausea: Secondary | ICD-10-CM | POA: Diagnosis not present

## 2014-07-30 DIAGNOSIS — N1330 Unspecified hydronephrosis: Secondary | ICD-10-CM | POA: Diagnosis not present

## 2014-07-30 DIAGNOSIS — N2889 Other specified disorders of kidney and ureter: Secondary | ICD-10-CM | POA: Diagnosis not present

## 2014-07-31 DIAGNOSIS — N201 Calculus of ureter: Secondary | ICD-10-CM | POA: Diagnosis not present

## 2014-07-31 DIAGNOSIS — I493 Ventricular premature depolarization: Secondary | ICD-10-CM | POA: Diagnosis not present

## 2014-07-31 DIAGNOSIS — Z79899 Other long term (current) drug therapy: Secondary | ICD-10-CM | POA: Diagnosis not present

## 2014-07-31 DIAGNOSIS — N200 Calculus of kidney: Secondary | ICD-10-CM | POA: Diagnosis not present

## 2014-08-03 DIAGNOSIS — N200 Calculus of kidney: Secondary | ICD-10-CM | POA: Diagnosis not present

## 2014-08-07 DIAGNOSIS — D1801 Hemangioma of skin and subcutaneous tissue: Secondary | ICD-10-CM | POA: Diagnosis not present

## 2014-08-07 DIAGNOSIS — L821 Other seborrheic keratosis: Secondary | ICD-10-CM | POA: Diagnosis not present

## 2014-08-07 DIAGNOSIS — D225 Melanocytic nevi of trunk: Secondary | ICD-10-CM | POA: Diagnosis not present

## 2014-08-07 DIAGNOSIS — L570 Actinic keratosis: Secondary | ICD-10-CM | POA: Diagnosis not present

## 2014-08-17 DIAGNOSIS — N200 Calculus of kidney: Secondary | ICD-10-CM | POA: Diagnosis not present

## 2014-10-05 DIAGNOSIS — Z79899 Other long term (current) drug therapy: Secondary | ICD-10-CM | POA: Diagnosis not present

## 2014-10-05 DIAGNOSIS — R7301 Impaired fasting glucose: Secondary | ICD-10-CM | POA: Diagnosis not present

## 2014-10-05 DIAGNOSIS — N200 Calculus of kidney: Secondary | ICD-10-CM | POA: Diagnosis not present

## 2014-10-05 DIAGNOSIS — D126 Benign neoplasm of colon, unspecified: Secondary | ICD-10-CM | POA: Diagnosis not present

## 2014-10-05 DIAGNOSIS — Z0000 Encounter for general adult medical examination without abnormal findings: Secondary | ICD-10-CM | POA: Diagnosis not present

## 2014-10-05 DIAGNOSIS — E786 Lipoprotein deficiency: Secondary | ICD-10-CM | POA: Diagnosis not present

## 2014-10-05 DIAGNOSIS — K219 Gastro-esophageal reflux disease without esophagitis: Secondary | ICD-10-CM | POA: Diagnosis not present

## 2014-12-29 DIAGNOSIS — N200 Calculus of kidney: Secondary | ICD-10-CM | POA: Diagnosis not present

## 2014-12-29 DIAGNOSIS — Z87442 Personal history of urinary calculi: Secondary | ICD-10-CM | POA: Diagnosis not present

## 2014-12-29 DIAGNOSIS — K5730 Diverticulosis of large intestine without perforation or abscess without bleeding: Secondary | ICD-10-CM | POA: Diagnosis not present

## 2014-12-29 DIAGNOSIS — R109 Unspecified abdominal pain: Secondary | ICD-10-CM | POA: Diagnosis not present

## 2014-12-29 DIAGNOSIS — Q631 Lobulated, fused and horseshoe kidney: Secondary | ICD-10-CM | POA: Diagnosis not present

## 2015-01-09 DIAGNOSIS — S32000A Wedge compression fracture of unspecified lumbar vertebra, initial encounter for closed fracture: Secondary | ICD-10-CM | POA: Diagnosis not present

## 2015-06-20 DIAGNOSIS — Z23 Encounter for immunization: Secondary | ICD-10-CM | POA: Diagnosis not present

## 2015-06-25 DIAGNOSIS — N401 Enlarged prostate with lower urinary tract symptoms: Secondary | ICD-10-CM | POA: Diagnosis not present

## 2015-07-02 DIAGNOSIS — Z87442 Personal history of urinary calculi: Secondary | ICD-10-CM | POA: Diagnosis not present

## 2015-07-02 DIAGNOSIS — N200 Calculus of kidney: Secondary | ICD-10-CM | POA: Diagnosis not present

## 2015-07-09 DIAGNOSIS — N400 Enlarged prostate without lower urinary tract symptoms: Secondary | ICD-10-CM | POA: Diagnosis not present

## 2015-07-09 DIAGNOSIS — Z87442 Personal history of urinary calculi: Secondary | ICD-10-CM | POA: Diagnosis not present

## 2015-07-09 DIAGNOSIS — Z79899 Other long term (current) drug therapy: Secondary | ICD-10-CM | POA: Diagnosis not present

## 2015-07-09 DIAGNOSIS — N529 Male erectile dysfunction, unspecified: Secondary | ICD-10-CM | POA: Diagnosis not present

## 2015-07-09 DIAGNOSIS — N200 Calculus of kidney: Secondary | ICD-10-CM | POA: Diagnosis not present

## 2015-07-09 DIAGNOSIS — Q631 Lobulated, fused and horseshoe kidney: Secondary | ICD-10-CM | POA: Diagnosis not present

## 2015-07-17 DIAGNOSIS — N200 Calculus of kidney: Secondary | ICD-10-CM | POA: Diagnosis not present

## 2015-08-14 DIAGNOSIS — L821 Other seborrheic keratosis: Secondary | ICD-10-CM | POA: Diagnosis not present

## 2015-08-14 DIAGNOSIS — D225 Melanocytic nevi of trunk: Secondary | ICD-10-CM | POA: Diagnosis not present

## 2015-08-14 DIAGNOSIS — L812 Freckles: Secondary | ICD-10-CM | POA: Diagnosis not present

## 2015-08-14 DIAGNOSIS — L570 Actinic keratosis: Secondary | ICD-10-CM | POA: Diagnosis not present

## 2015-08-14 DIAGNOSIS — D1801 Hemangioma of skin and subcutaneous tissue: Secondary | ICD-10-CM | POA: Diagnosis not present

## 2015-10-31 DIAGNOSIS — H5213 Myopia, bilateral: Secondary | ICD-10-CM | POA: Diagnosis not present

## 2015-10-31 DIAGNOSIS — H52203 Unspecified astigmatism, bilateral: Secondary | ICD-10-CM | POA: Diagnosis not present

## 2015-10-31 DIAGNOSIS — H524 Presbyopia: Secondary | ICD-10-CM | POA: Diagnosis not present

## 2016-03-10 DIAGNOSIS — T1590XA Foreign body on external eye, part unspecified, unspecified eye, initial encounter: Secondary | ICD-10-CM | POA: Diagnosis not present

## 2016-03-10 DIAGNOSIS — H578 Other specified disorders of eye and adnexa: Secondary | ICD-10-CM | POA: Diagnosis not present

## 2016-03-10 DIAGNOSIS — H5712 Ocular pain, left eye: Secondary | ICD-10-CM | POA: Diagnosis not present

## 2016-06-05 ENCOUNTER — Other Ambulatory Visit: Payer: Self-pay

## 2016-07-17 DIAGNOSIS — N400 Enlarged prostate without lower urinary tract symptoms: Secondary | ICD-10-CM | POA: Diagnosis not present

## 2016-07-24 DIAGNOSIS — N200 Calculus of kidney: Secondary | ICD-10-CM | POA: Diagnosis not present

## 2016-08-12 DIAGNOSIS — L570 Actinic keratosis: Secondary | ICD-10-CM | POA: Diagnosis not present

## 2016-08-12 DIAGNOSIS — L821 Other seborrheic keratosis: Secondary | ICD-10-CM | POA: Diagnosis not present

## 2016-08-12 DIAGNOSIS — D235 Other benign neoplasm of skin of trunk: Secondary | ICD-10-CM | POA: Diagnosis not present

## 2016-08-12 DIAGNOSIS — D1801 Hemangioma of skin and subcutaneous tissue: Secondary | ICD-10-CM | POA: Diagnosis not present

## 2016-08-12 DIAGNOSIS — L814 Other melanin hyperpigmentation: Secondary | ICD-10-CM | POA: Diagnosis not present

## 2016-09-01 DIAGNOSIS — Z23 Encounter for immunization: Secondary | ICD-10-CM | POA: Diagnosis not present

## 2017-07-16 DIAGNOSIS — Z23 Encounter for immunization: Secondary | ICD-10-CM | POA: Diagnosis not present

## 2017-07-21 DIAGNOSIS — N400 Enlarged prostate without lower urinary tract symptoms: Secondary | ICD-10-CM | POA: Diagnosis not present

## 2017-07-27 DIAGNOSIS — N400 Enlarged prostate without lower urinary tract symptoms: Secondary | ICD-10-CM | POA: Diagnosis not present

## 2017-07-27 DIAGNOSIS — N200 Calculus of kidney: Secondary | ICD-10-CM | POA: Diagnosis not present

## 2017-08-27 DIAGNOSIS — D1801 Hemangioma of skin and subcutaneous tissue: Secondary | ICD-10-CM | POA: Diagnosis not present

## 2017-08-27 DIAGNOSIS — L814 Other melanin hyperpigmentation: Secondary | ICD-10-CM | POA: Diagnosis not present

## 2017-08-27 DIAGNOSIS — D229 Melanocytic nevi, unspecified: Secondary | ICD-10-CM | POA: Diagnosis not present

## 2017-08-27 DIAGNOSIS — L821 Other seborrheic keratosis: Secondary | ICD-10-CM | POA: Diagnosis not present

## 2017-08-27 DIAGNOSIS — L820 Inflamed seborrheic keratosis: Secondary | ICD-10-CM | POA: Diagnosis not present

## 2018-06-22 DIAGNOSIS — Z23 Encounter for immunization: Secondary | ICD-10-CM | POA: Diagnosis not present

## 2018-06-22 DIAGNOSIS — I8393 Asymptomatic varicose veins of bilateral lower extremities: Secondary | ICD-10-CM | POA: Diagnosis not present

## 2018-06-22 DIAGNOSIS — R5383 Other fatigue: Secondary | ICD-10-CM | POA: Diagnosis not present

## 2018-06-28 ENCOUNTER — Other Ambulatory Visit: Payer: Self-pay | Admitting: Family Medicine

## 2018-06-28 DIAGNOSIS — I83891 Varicose veins of right lower extremities with other complications: Secondary | ICD-10-CM

## 2018-07-01 ENCOUNTER — Other Ambulatory Visit: Payer: Medicare Other

## 2018-07-08 ENCOUNTER — Ambulatory Visit
Admission: RE | Admit: 2018-07-08 | Discharge: 2018-07-08 | Disposition: A | Discharge status: Home / Self Care | Payer: Medicare Other | Source: Ambulatory Visit | Attending: Family Medicine | Admitting: Family Medicine

## 2018-07-08 ENCOUNTER — Other Ambulatory Visit: Payer: Medicare Other

## 2018-07-08 DIAGNOSIS — I83891 Varicose veins of right lower extremities with other complications: Secondary | ICD-10-CM

## 2018-07-08 DIAGNOSIS — I8311 Varicose veins of right lower extremity with inflammation: Secondary | ICD-10-CM | POA: Diagnosis not present

## 2018-07-08 NOTE — Consult Note (Signed)
Chief Complaint: Patient was seen in consultation today for  Chief Complaint  Patient presents with  . Consult    [facility] for Varicose Veins   at the request of [place]  Referring Physician(s): Little,Kevin  History of Present Illness: Vincent Mcbride is a 77 y.o. 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  Patient has been treated for the venous insufficiency by Dr. Barbie Banner.  Patient had endovascular laser treatment to the right short saphenous vein in 2009 and subsequent ultrasound-guided foam sclerotherapy to varicosities.  The last sclerotherapy was performed in 07/05/2013.  Patient complains of enlarging varicose veins in the right calf and behind the right knee.  He has intermittent pain in the right lower extremity.  Intermittent lateral knee pain while driving.  Occasional sensitivity and pain associated with the lateral calf varicosities.  Patient complains of occasional mild swelling.  Patient is not taking any pain medications for the right leg symptoms at this time.  Patient is not currently using compression stockings.  Patient is very active and recently helped a family member with a home renovation.  No major health concerns at this time.  No past medical history on file.  Allergies: Contrast media [iodinated diagnostic agents]  Medications: Prior to Admission medications   Medication Sig Start Date End Date Taking? Authorizing Provider  aspirin EC 81 MG tablet Take 81 mg by mouth daily.   Yes [provider]  Cholecalciferol (VITAMIN D3) 5000 units TABS Take 1 tablet by mouth daily.    [provider]  Coenzyme Q10 100 MG capsule Take 1 capsule by mouth daily.    [provider]  Multiple Vitamins-Minerals (MULTIVITAMIN PO) Take 1 capsule by mouth daily.    [provider]     No family history on file.  Social History   Socioeconomic History  . Marital status: Married   Spouse name: Not on file  . Number of children: Not on file  . Years of education: Not on file  . Highest education level: Not on file  Occupational History  . Not on file  Social Needs  . Financial resource strain: Not on file  . Food insecurity:    Worry: Not on file    Inability: Not on file  . Transportation needs:    Medical: Not on file    Non-medical: Not on file  Tobacco Use  . Smoking status: Not on file  Substance and Sexual Activity  . Alcohol use: Not on file  . Drug use: Not on file  . Sexual activity: Not on file  Lifestyle  . Physical activity:    Days per week: Not on file    Minutes per session: Not on file  . Stress: Not on file  Relationships  . Social connections:    Talks on phone: Not on file    Gets together: Not on file    Attends religious service: Not on file    Active member of club or organization: Not on file    Attends meetings of clubs or organizations: Not on file    Relationship status: Not on file  Other Topics Concern  . Not on file  Social History Narrative  . Not on file      Review of Systems  Musculoskeletal:       Right leg pain with varicosities.  Neurological: Positive for numbness.       Chronic numbness in  the feet.    Vital Signs: BP 121/63   Pulse (!) 59   Temp 97.9 F (36.6 C) (Oral)   Resp 15   Ht 5' 9.75" (1.772 m)   Wt 85.3 kg   SpO2 97%   BMI 27.17 kg/m   Physical Exam  Constitutional: No distress.  Cardiovascular:  2+ pulses in the right DP and right PT.  Musculoskeletal:  Left lower extremity: Small varicosities in the left calf.  Right lower extremity: Large varicosities in the posterior knee and popliteal region.  Varicosities extending into the posterior mid calf and small varicosities along the mid and distal lateral right calf.  Minimal swelling of the right ankle.  Negative for ulcerations or bleeding.         Imaging: US Venous Img Lower Unilateral Right  Result Date:  07/08/2018 CLINICAL DATA:  77 year old with symptomatic right leg varicose veins. Prior endovascular laser therapy to the right short saphenous vein and previous ultrasound-guided foam sclerotherapy to varicosities in the right lower extremity. EXAM: RIGHT LOWER EXTREMITY VENOUS DOPPLER ULTRASOUND TECHNIQUE: Gray-scale sonography with graded compression, as well as color Doppler and duplex ultrasound were performed to evaluate the lower extremity deep venous systems from the level of the common femoral vein and including the common femoral, femoral, profunda femoral, popliteal and calf veins including the posterior tibial, peroneal and gastrocnemius veins when visible. The superficial great saphenous vein was also interrogated. Spectral Doppler was utilized to evaluate flow at rest and with distal augmentation maneuvers in the common femoral, femoral and popliteal veins. Dedicated images of the superficial venous system were obtained. I personally performed the superficial venous insufficiency study. COMPARISON:  None. FINDINGS: Deep venous system: Normal compressibility, augmentation and color Doppler flow in the right common femoral vein, right femoral vein and right popliteal vein without thrombus. Reflux associated with the right popliteal vein. The right saphenofemoral junction is patent. Right profunda femoral vein is patent without thrombus. Superficial venous system: Right great saphenous vein is compressible without thrombus or reflux. Normal caliber of the a right great saphenous vein. Small short saphenous vein in the knee region extends up the posterior thigh and compatible with a vein of Giacomini. There is patent compressible short saphenous vein associated varicosities in the mid calf. The distal short saphenous vein is also patent. There is reflux within the mid calf short saphenous vein measuring up to 2.3 seconds. Large varicosities along the posterior knee and posterior mid calf. There are also  varicosities along the lateral distal calf. Some of the varicosities are connected to the short saphenous vein in the mid calf. Many of the large varicosities are associated with deep perforated branches. There is a large varicosity associated with the popliteal vein. This perforator branch associated with the popliteal vein has 5 seconds of reflux. IMPRESSION: 1. Positive for venous insufficiency in the right lower extremity. Venous insufficiency is related to a combination of perforated disease and reflux from the right short saphenous vein. 2.  Negative for deep venous thrombosis in right lower extremity. Electronically Signed   By: Markus Daft M.D.   On: 07/08/2018 10:43   [place] [facility] Eval And Mgmt  Result Date: 07/08/2018 Please refer to "Notes" to see consult details.   Labs:  CBC: No results for input(s): WBC, HGB, HCT, PLT in the last 8760 hours.  COAGS: No results for input(s): INR, APTT in the last 8760 hours.  BMP: No results for input(s): NA, K, CL, CO2, GLUCOSE, BUN, CALCIUM, CREATININE, GFRNONAA,  GFRAA in the last 8760 hours.  Invalid input(s): CMP  LIVER FUNCTION TESTS: No results for input(s): BILITOT, AST, ALT, ALKPHOS, PROT, ALBUMIN in the last 8760 hours.  TUMOR MARKERS: No results for input(s): AFPTM, CEA, CA199, CHROMGRNA in the last 8760 hours.  Assessment and Plan:  77 year old with venous insufficiency in the right lower extremity.  Venous insufficiency in the right lower extremity is multifactorial.  There is reflux and varicosities associated with a partially recanalized right short saphenous vein.  There are multiple varicosities associated with perforator branches.  In addition, there is reflux associated with the right popliteal vein.  CEAP classification is C2, Ep, As, Ap, Ad, Pr.  At this time, the patient has intermittent mild symptoms.  Patient is not requiring pain medications.  We discussed the treatment options.  The right short saphenous vein would be  amendable to endovascular laser treatment.  Would plan for ultrasound-guided foam sclerotherapy of the varicosities associated with the right short saphenous vein.  The smaller varicose veins associated with perforator branches, particularly along the lateral right calf, would be amenable to ultrasound-guided foam sclerotherapy.  The larger varicosities in the popliteal region which are associated with the deep venous system and large perforator branches may be more difficult to treat.  We could try ultrasound-guided foam sclerotherapy of these large varicosities but based on the size they may need ambulatory phlebectomy.  At this time, the large varicosities along the popliteal region are not significantly symptomatically.  Patient complains mostly of the varicosities along the lateral distal calf.  Again, these lateral calf varicosities are amendable to ultrasound-guided foam sclerotherapy.  At this time, the patient would like to continue with conservative therapy.  I prescribed him new thigh-high compression stockings measuring 20 to 30 mmHg.  Patient will see how he feels with the compression stockings.  If he continues to have symptoms, we will discuss the treatment options again.  Patient will follow-up as needed.  Thank you for this interesting consult.  I greatly enjoyed meeting Vincent Mcbride and look forward to participating in their care.  A copy of this report was sent to the requesting provider on this date.  Electronically Signed: Burman Riis 07/08/2018, 11:14 AM   I spent a total of    25 Minutes in face to face in clinical consultation, greater than 50% of which was counseling/coordinating care for symptomatic varicose veins.

## 2018-08-06 DIAGNOSIS — Z125 Encounter for screening for malignant neoplasm of prostate: Secondary | ICD-10-CM | POA: Diagnosis not present

## 2018-08-06 DIAGNOSIS — D126 Benign neoplasm of colon, unspecified: Secondary | ICD-10-CM | POA: Diagnosis not present

## 2018-08-06 DIAGNOSIS — Z0000 Encounter for general adult medical examination without abnormal findings: Secondary | ICD-10-CM | POA: Diagnosis not present

## 2018-08-06 DIAGNOSIS — E782 Mixed hyperlipidemia: Secondary | ICD-10-CM | POA: Diagnosis not present

## 2018-08-06 DIAGNOSIS — Z1211 Encounter for screening for malignant neoplasm of colon: Secondary | ICD-10-CM | POA: Diagnosis not present

## 2018-08-30 DIAGNOSIS — D225 Melanocytic nevi of trunk: Secondary | ICD-10-CM | POA: Diagnosis not present

## 2018-08-30 DIAGNOSIS — L820 Inflamed seborrheic keratosis: Secondary | ICD-10-CM | POA: Diagnosis not present

## 2018-08-30 DIAGNOSIS — L814 Other melanin hyperpigmentation: Secondary | ICD-10-CM | POA: Diagnosis not present

## 2018-08-30 DIAGNOSIS — L918 Other hypertrophic disorders of the skin: Secondary | ICD-10-CM | POA: Diagnosis not present

## 2018-08-30 DIAGNOSIS — D1801 Hemangioma of skin and subcutaneous tissue: Secondary | ICD-10-CM | POA: Diagnosis not present

## 2018-08-30 DIAGNOSIS — L821 Other seborrheic keratosis: Secondary | ICD-10-CM | POA: Diagnosis not present

## 2018-08-31 DIAGNOSIS — N400 Enlarged prostate without lower urinary tract symptoms: Secondary | ICD-10-CM | POA: Diagnosis not present

## 2018-08-31 DIAGNOSIS — Q631 Lobulated, fused and horseshoe kidney: Secondary | ICD-10-CM | POA: Diagnosis not present

## 2018-08-31 DIAGNOSIS — N200 Calculus of kidney: Secondary | ICD-10-CM | POA: Diagnosis not present

## 2018-08-31 DIAGNOSIS — N529 Male erectile dysfunction, unspecified: Secondary | ICD-10-CM | POA: Diagnosis not present

## 2018-09-16 DIAGNOSIS — N200 Calculus of kidney: Secondary | ICD-10-CM | POA: Diagnosis not present

## 2018-09-16 DIAGNOSIS — Q631 Lobulated, fused and horseshoe kidney: Secondary | ICD-10-CM | POA: Diagnosis not present

## 2018-10-19 DIAGNOSIS — N200 Calculus of kidney: Secondary | ICD-10-CM | POA: Diagnosis not present

## 2018-10-19 DIAGNOSIS — N201 Calculus of ureter: Secondary | ICD-10-CM | POA: Diagnosis not present

## 2018-10-20 DIAGNOSIS — K5730 Diverticulosis of large intestine without perforation or abscess without bleeding: Secondary | ICD-10-CM | POA: Diagnosis not present

## 2018-10-20 DIAGNOSIS — D120 Benign neoplasm of cecum: Secondary | ICD-10-CM | POA: Diagnosis not present

## 2018-10-20 DIAGNOSIS — Z800 Family history of malignant neoplasm of digestive organs: Secondary | ICD-10-CM | POA: Diagnosis not present

## 2018-10-22 DIAGNOSIS — D120 Benign neoplasm of cecum: Secondary | ICD-10-CM | POA: Diagnosis not present

## 2018-11-01 DIAGNOSIS — Q631 Lobulated, fused and horseshoe kidney: Secondary | ICD-10-CM | POA: Diagnosis not present

## 2018-11-01 DIAGNOSIS — N200 Calculus of kidney: Secondary | ICD-10-CM | POA: Diagnosis not present

## 2018-11-29 DIAGNOSIS — N200 Calculus of kidney: Secondary | ICD-10-CM | POA: Diagnosis not present

## 2019-07-07 DIAGNOSIS — N400 Enlarged prostate without lower urinary tract symptoms: Secondary | ICD-10-CM | POA: Diagnosis not present

## 2019-07-13 DIAGNOSIS — N200 Calculus of kidney: Secondary | ICD-10-CM | POA: Diagnosis not present

## 2019-07-19 DIAGNOSIS — Z23 Encounter for immunization: Secondary | ICD-10-CM | POA: Diagnosis not present

## 2019-07-21 DIAGNOSIS — H52203 Unspecified astigmatism, bilateral: Secondary | ICD-10-CM | POA: Diagnosis not present

## 2019-07-21 DIAGNOSIS — H5213 Myopia, bilateral: Secondary | ICD-10-CM | POA: Diagnosis not present

## 2019-07-21 DIAGNOSIS — H25011 Cortical age-related cataract, right eye: Secondary | ICD-10-CM | POA: Diagnosis not present

## 2019-08-23 DIAGNOSIS — E559 Vitamin D deficiency, unspecified: Secondary | ICD-10-CM | POA: Diagnosis not present

## 2019-08-23 DIAGNOSIS — K219 Gastro-esophageal reflux disease without esophagitis: Secondary | ICD-10-CM | POA: Diagnosis not present

## 2019-08-23 DIAGNOSIS — Z0000 Encounter for general adult medical examination without abnormal findings: Secondary | ICD-10-CM | POA: Diagnosis not present

## 2019-08-23 DIAGNOSIS — E782 Mixed hyperlipidemia: Secondary | ICD-10-CM | POA: Diagnosis not present

## 2019-08-23 DIAGNOSIS — R7301 Impaired fasting glucose: Secondary | ICD-10-CM | POA: Diagnosis not present

## 2019-08-23 DIAGNOSIS — N529 Male erectile dysfunction, unspecified: Secondary | ICD-10-CM | POA: Diagnosis not present

## 2019-09-01 DIAGNOSIS — L570 Actinic keratosis: Secondary | ICD-10-CM | POA: Diagnosis not present

## 2019-09-01 DIAGNOSIS — L820 Inflamed seborrheic keratosis: Secondary | ICD-10-CM | POA: Diagnosis not present

## 2019-09-01 DIAGNOSIS — D1801 Hemangioma of skin and subcutaneous tissue: Secondary | ICD-10-CM | POA: Diagnosis not present

## 2019-09-01 DIAGNOSIS — R208 Other disturbances of skin sensation: Secondary | ICD-10-CM | POA: Diagnosis not present

## 2019-09-01 DIAGNOSIS — L821 Other seborrheic keratosis: Secondary | ICD-10-CM | POA: Diagnosis not present

## 2019-09-01 DIAGNOSIS — D225 Melanocytic nevi of trunk: Secondary | ICD-10-CM | POA: Diagnosis not present

## 2019-09-01 DIAGNOSIS — L814 Other melanin hyperpigmentation: Secondary | ICD-10-CM | POA: Diagnosis not present

## 2019-09-05 DIAGNOSIS — Z0000 Encounter for general adult medical examination without abnormal findings: Secondary | ICD-10-CM | POA: Diagnosis not present

## 2019-09-05 DIAGNOSIS — R7301 Impaired fasting glucose: Secondary | ICD-10-CM | POA: Diagnosis not present

## 2019-09-05 DIAGNOSIS — N529 Male erectile dysfunction, unspecified: Secondary | ICD-10-CM | POA: Diagnosis not present

## 2019-09-05 DIAGNOSIS — K219 Gastro-esophageal reflux disease without esophagitis: Secondary | ICD-10-CM | POA: Diagnosis not present

## 2019-09-05 DIAGNOSIS — E559 Vitamin D deficiency, unspecified: Secondary | ICD-10-CM | POA: Diagnosis not present

## 2019-09-05 DIAGNOSIS — E782 Mixed hyperlipidemia: Secondary | ICD-10-CM | POA: Diagnosis not present

## 2019-10-19 ENCOUNTER — Ambulatory Visit: Payer: Medicare Other | Attending: Internal Medicine

## 2019-10-19 ENCOUNTER — Other Ambulatory Visit: Payer: Self-pay

## 2019-10-19 DIAGNOSIS — Z23 Encounter for immunization: Secondary | ICD-10-CM | POA: Insufficient documentation

## 2019-10-19 NOTE — Progress Notes (Signed)
   Covid-19 Vaccination Clinic  Name:  JERRIE ERLING    MRN: [id number] DOB: 23-Jul-1941  10/19/2019  Mr. Scheid was observed post Covid-19 immunization for 15 minutes without incidence. He was provided with Vaccine Information Sheet and instruction to access the V-Safe system.   Mr. Forsell was instructed to call 911 with any severe reactions post vaccine: Marland Kitchen Difficulty breathing  . Swelling of your face and throat  . A fast heartbeat  . A bad rash all over your body  . Dizziness and weakness    Immunizations Administered    Name Date Dose VIS Date Route   Pfizer COVID-19 Vaccine 10/19/2019  4:01 PM 0.3 mL 09/09/2019 Intramuscular   Manufacturer: [place]   Lot: [id number]   [place]: [id number]

## 2019-10-29 ENCOUNTER — Ambulatory Visit: Payer: Medicare Other

## 2019-11-09 ENCOUNTER — Ambulatory Visit: Payer: Medicare Other | Attending: Internal Medicine

## 2019-11-09 ENCOUNTER — Ambulatory Visit: Payer: Medicare Other

## 2019-11-09 DIAGNOSIS — Z23 Encounter for immunization: Secondary | ICD-10-CM | POA: Insufficient documentation

## 2019-11-09 NOTE — Progress Notes (Signed)
   Covid-19 Vaccination Clinic  Name:  Vincent Mcbride    MRN: [id number] DOB: 1941/02/26  11/09/2019  Vincent Mcbride was observed post Covid-19 immunization for 15 minutes without incidence. He was provided with Vaccine Information Sheet and instruction to access the V-Safe system.   Vincent Mcbride was instructed to call 911 with any severe reactions post vaccine: Marland Kitchen Difficulty breathing  . Swelling of your face and throat  . A fast heartbeat  . A bad rash all over your body  . Dizziness and weakness    Immunizations Administered    Name Date Dose VIS Date Route   Pfizer COVID-19 Vaccine 11/09/2019 11:50 AM 0.3 mL 09/09/2019 Intramuscular   Manufacturer: [place]   Lot: [id number]   [place]: [id number]

## 2020-05-09 ENCOUNTER — Encounter: Payer: Self-pay | Admitting: *Deleted

## 2020-05-09 ENCOUNTER — Other Ambulatory Visit: Payer: Self-pay

## 2020-05-09 ENCOUNTER — Ambulatory Visit: Payer: Medicare Other | Admitting: Cardiology

## 2020-05-09 ENCOUNTER — Encounter: Payer: Self-pay | Admitting: Cardiology

## 2020-05-09 VITALS — BP 130/60 | HR 66 | Ht 69.75 in | Wt 195.0 lb

## 2020-05-09 DIAGNOSIS — R5383 Other fatigue: Secondary | ICD-10-CM

## 2020-05-09 DIAGNOSIS — R0600 Dyspnea, unspecified: Secondary | ICD-10-CM | POA: Insufficient documentation

## 2020-05-09 DIAGNOSIS — R072 Precordial pain: Secondary | ICD-10-CM

## 2020-05-09 DIAGNOSIS — E782 Mixed hyperlipidemia: Secondary | ICD-10-CM

## 2020-05-09 DIAGNOSIS — R0602 Shortness of breath: Secondary | ICD-10-CM

## 2020-05-09 DIAGNOSIS — R079 Chest pain, unspecified: Secondary | ICD-10-CM

## 2020-05-09 HISTORY — DX: Precordial pain: R07.2

## 2020-05-09 HISTORY — DX: Chest pain, unspecified: R07.9

## 2020-05-09 HISTORY — DX: Mixed hyperlipidemia: E78.2

## 2020-05-09 HISTORY — DX: Shortness of breath: R06.02

## 2020-05-09 MED ORDER — NITROGLYCERIN 0.4 MG SL SUBL: 0.4000 mg | SUBLINGUAL_TABLET | SUBLINGUAL | 3 refills | Status: DC | PRN

## 2020-05-09 NOTE — Progress Notes (Signed)
195lb
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
## 2020-05-09 NOTE — Patient Instructions (Addendum)
Medication Instructions:  Your physician has recommended you make the following change in your medication:  START: Nitroglycerin 0.4 mg take one tablet by mouth every 5 minutes as needed for chest pain.  *If you need a refill on your cardiac medications before your next appointment, please call your pharmacy*   Lab Work: None If you have labs (blood work) drawn today and your tests are completely normal, you will receive your results only by: Marland Kitchen MyChart Message (if you have MyChart) OR . A paper copy in the mail If you have any lab test that is abnormal or we need to change your treatment, we will call you to review the results.   Testing/Procedures: Your physician has requested that you have an echocardiogram. Echocardiography is a painless test that uses sound waves to create images of your heart. It provides your doctor with information about the size and shape of your heart and how well your heart's chambers and valves are working. This procedure takes approximately one hour. There are no restrictions for this procedure.    [facility] at [facility] [address], [place], [place] [address] Phone: [phone]    Please arrive 15 minutes prior to your appointment time for registration and insurance purposes.  The test will take approximately 3 to 4 hours to complete; you may bring reading material.  If someone comes with you to your appointment, they will need to remain in the main lobby due to limited space in the testing area. **If you are pregnant or breastfeeding, please notify the nuclear lab prior to your appointment**  How to prepare for your Myocardial Perfusion Test: . Do not eat or drink 3 hours prior to your test, except you may have water. . Do not consume products containing caffeine (regular or decaffeinated) 12 hours prior to your test. (ex: coffee, chocolate, sodas, tea). . Do bring a list of your current medications with  you.  If not listed below, you may take your medications as normal. . HOLD Erectile dysfunction medication: Cialis . Do wear comfortable clothes (no dresses or overalls) and walking shoes, tennis shoes preferred (No heels or open toe shoes are allowed). . Do NOT wear cologne, perfume, aftershave, or lotions (deodorant is allowed). . If these instructions are not followed, your test will have to be rescheduled.  Please report to [address], [address] for your test.  If you have questions or concerns about your appointment, you can call the Nuclear Lab at [phone].  If you cannot keep your appointment, please provide 24 hours notification to the Nuclear Lab, to avoid a possible $50 charge to your account.    Follow-Up: At [facility], you and your health needs are our priority.  As part of our continuing mission to provide you with exceptional heart care, we have created designated Provider Care Teams.  These Care Teams include your primary Cardiologist (physician) and Advanced Practice Providers (APPs -  Physician Assistants and Nurse Practitioners) who all work together to provide you with the care you need, when you need it.  We recommend signing up for the patient portal called "MyChart".  Sign up information is provided on this After Visit Summary.  MyChart is used to connect with patients for Virtual Visits (Telemedicine).  Patients are able to view lab/test results, encounter notes, upcoming appointments, etc.  Non-urgent messages can be sent to your provider as well.   To learn more about what you can do with  MyChart, go to [email or web address].    Your next appointment:   3 month(s)  The format for your next appointment:   In Person  Provider:   Berniece Salines, DO   Other Instructions

## 2020-05-16 ENCOUNTER — Telehealth (HOSPITAL_COMMUNITY): Payer: Self-pay

## 2020-05-16 NOTE — Telephone Encounter (Signed)
Spoke with the patient, detailed instructions given. Asked to call back with any questions. S.[place] EMTP

## 2020-05-22 ENCOUNTER — Encounter (HOSPITAL_COMMUNITY): Payer: Self-pay | Admitting: Emergency Medicine

## 2020-05-22 ENCOUNTER — Emergency Department (HOSPITAL_COMMUNITY): Payer: Medicare Other

## 2020-05-22 ENCOUNTER — Other Ambulatory Visit: Payer: Self-pay

## 2020-05-22 ENCOUNTER — Ambulatory Visit (HOSPITAL_BASED_OUTPATIENT_CLINIC_OR_DEPARTMENT_OTHER): Payer: Medicare Other

## 2020-05-22 ENCOUNTER — Telehealth: Payer: Self-pay | Admitting: Cardiology

## 2020-05-22 ENCOUNTER — Observation Stay (HOSPITAL_COMMUNITY)
Admission: EM | Admit: 2020-05-22 | Discharge: 2020-05-23 | Disposition: A | Discharge status: Home / Self Care | Payer: Medicare Other | Attending: Cardiology | Admitting: Cardiology

## 2020-05-22 DIAGNOSIS — R0602 Shortness of breath: Secondary | ICD-10-CM

## 2020-05-22 DIAGNOSIS — Z79899 Other long term (current) drug therapy: Secondary | ICD-10-CM | POA: Diagnosis not present

## 2020-05-22 DIAGNOSIS — Z7982 Long term (current) use of aspirin: Secondary | ICD-10-CM | POA: Insufficient documentation

## 2020-05-22 DIAGNOSIS — R079 Chest pain, unspecified: Secondary | ICD-10-CM

## 2020-05-22 DIAGNOSIS — I10 Essential (primary) hypertension: Secondary | ICD-10-CM | POA: Insufficient documentation

## 2020-05-22 DIAGNOSIS — I2584 Coronary atherosclerosis due to calcified coronary lesion: Secondary | ICD-10-CM | POA: Insufficient documentation

## 2020-05-22 DIAGNOSIS — R9439 Abnormal result of other cardiovascular function study: Secondary | ICD-10-CM

## 2020-05-22 DIAGNOSIS — N401 Enlarged prostate with lower urinary tract symptoms: Secondary | ICD-10-CM | POA: Diagnosis not present

## 2020-05-22 DIAGNOSIS — Z91041 Radiographic dye allergy status: Secondary | ICD-10-CM | POA: Insufficient documentation

## 2020-05-22 DIAGNOSIS — Z20822 Contact with and (suspected) exposure to covid-19: Secondary | ICD-10-CM | POA: Insufficient documentation

## 2020-05-22 DIAGNOSIS — R072 Precordial pain: Secondary | ICD-10-CM | POA: Diagnosis not present

## 2020-05-22 DIAGNOSIS — R300 Dysuria: Secondary | ICD-10-CM | POA: Insufficient documentation

## 2020-05-22 DIAGNOSIS — Z9079 Acquired absence of other genital organ(s): Secondary | ICD-10-CM | POA: Insufficient documentation

## 2020-05-22 DIAGNOSIS — E785 Hyperlipidemia, unspecified: Secondary | ICD-10-CM | POA: Diagnosis not present

## 2020-05-22 DIAGNOSIS — Z955 Presence of coronary angioplasty implant and graft: Secondary | ICD-10-CM

## 2020-05-22 DIAGNOSIS — Z8249 Family history of ischemic heart disease and other diseases of the circulatory system: Secondary | ICD-10-CM | POA: Insufficient documentation

## 2020-05-22 DIAGNOSIS — Z880 Allergy status to penicillin: Secondary | ICD-10-CM | POA: Insufficient documentation

## 2020-05-22 DIAGNOSIS — R0789 Other chest pain: Secondary | ICD-10-CM | POA: Diagnosis present

## 2020-05-22 DIAGNOSIS — Z9861 Coronary angioplasty status: Secondary | ICD-10-CM

## 2020-05-22 DIAGNOSIS — N319 Neuromuscular dysfunction of bladder, unspecified: Secondary | ICD-10-CM | POA: Diagnosis not present

## 2020-05-22 DIAGNOSIS — I2510 Atherosclerotic heart disease of native coronary artery without angina pectoris: Principal | ICD-10-CM

## 2020-05-22 HISTORY — DX: Chest pain, unspecified: R07.9

## 2020-05-22 LAB — MYOCARDIAL PERFUSION IMAGING
Estimated workload: 7 METS
Exercise duration (min): 6 min
LV dias vol: 126 mL (ref 62–150)
LV sys vol: 67 mL
MPHR: 141 {beats}/min
Peak HR: 139 {beats}/min
Percent HR: 98 %
RPE: 19
Rest HR: 60 {beats}/min
SDS: 5
SRS: 0
SSS: 5
TID: 1.27

## 2020-05-22 LAB — CBC
HCT: 43.3 % (ref 39.0–52.0)
HCT: 42.3 % (ref 39.0–52.0)
Hemoglobin: 14.6 g/dL (ref 13.0–17.0)
Hemoglobin: 13.9 g/dL (ref 13.0–17.0)
MCH: 32 pg (ref 26.0–34.0)
MCH: 31.2 pg (ref 26.0–34.0)
MCHC: 33.7 g/dL (ref 30.0–36.0)
MCHC: 32.9 g/dL (ref 30.0–36.0)
MCV: 95 fL (ref 80.0–100.0)
MCV: 94.8 fL (ref 80.0–100.0)
Platelets: 197 10*3/uL (ref 150–400)
Platelets: 215 10*3/uL (ref 150–400)
RBC: 4.56 MIL/uL (ref 4.22–5.81)
RBC: 4.46 MIL/uL (ref 4.22–5.81)
RDW: 12.9 % (ref 11.5–15.5)
RDW: 12.7 % (ref 11.5–15.5)
WBC: 7.3 10*3/uL (ref 4.0–10.5)
WBC: 8.1 10*3/uL (ref 4.0–10.5)
nRBC: 0 % (ref 0.0–0.2)
nRBC: 0 % (ref 0.0–0.2)

## 2020-05-22 LAB — CREATININE, SERUM
Creatinine, Ser: 1.07 mg/dL (ref 0.61–1.24)
GFR calc Af Amer: >60 mL/min (ref >60)
GFR calc non Af Amer: >60 mL/min (ref >60)

## 2020-05-22 LAB — BASIC METABOLIC PANEL
Anion gap: 9 (ref 5–15)
BUN: 15 mg/dL (ref 8–23)
CO2: 26 mmol/L (ref 22–32)
Calcium: 9.7 mg/dL (ref 8.9–10.3)
Chloride: 106 mmol/L (ref 98–111)
Creatinine, Ser: 1.09 mg/dL (ref 0.61–1.24)
GFR calc Af Amer: >60 mL/min (ref >60)
GFR calc non Af Amer: >60 mL/min (ref >60)
Glucose, Bld: 127 mg/dL — ABNORMAL HIGH (ref 70–99)
Potassium: 4.1 mmol/L (ref 3.5–5.1)
Sodium: 141 mmol/L (ref 135–145)

## 2020-05-22 LAB — ECHOCARDIOGRAM COMPLETE
Area-P 1/2: 1.7 cm2
S' Lateral: 3.2 cm

## 2020-05-22 LAB — SARS CORONAVIRUS 2 BY RT PCR (HOSPITAL ORDER, PERFORMED IN ~~LOC~~ HOSPITAL LAB): SARS Coronavirus 2: NEGATIVE

## 2020-05-22 LAB — TROPONIN I (HIGH SENSITIVITY)
Troponin I (High Sensitivity): 7 ng/L (ref <18)
Troponin I (High Sensitivity): 12 ng/L (ref <18)

## 2020-05-22 MED ORDER — SODIUM CHLORIDE 0.9 % IV SOLN: INTRAVENOUS | Status: DC

## 2020-05-22 MED ORDER — LORATADINE 10 MG PO TABS
10.0000 mg | ORAL_TABLET | Freq: Every day | ORAL | Status: DC
  Administered 2020-05-22 – 2020-05-23 (×2): 10 mg via ORAL
  Filled 2020-05-22 (×2): qty 1

## 2020-05-22 MED ORDER — PRESERVISION/LUTEIN PO CAPS: ORAL_CAPSULE | Freq: Every day | ORAL | Status: DC

## 2020-05-22 MED ORDER — METOPROLOL TARTRATE 25 MG PO TABS
25.0000 mg | ORAL_TABLET | Freq: Two times a day (BID) | ORAL | Status: DC
  Administered 2020-05-23: 25 mg via ORAL
  Filled 2020-05-22 (×2): qty 1

## 2020-05-22 MED ORDER — ASPIRIN EC 81 MG PO TBEC: 81.0000 mg | DELAYED_RELEASE_TABLET | Freq: Every day | ORAL | Status: DC

## 2020-05-22 MED ORDER — PREDNISONE 20 MG PO TABS
50.0000 mg | ORAL_TABLET | Freq: Four times a day (QID) | ORAL | Status: AC
  Administered 2020-05-22 – 2020-05-23 (×3): 50 mg via ORAL
  Filled 2020-05-22 (×3): qty 2

## 2020-05-22 MED ORDER — HEPARIN SODIUM (PORCINE) 5000 UNIT/ML IJ SOLN
5000.0000 [IU] | Freq: Three times a day (TID) | INTRAMUSCULAR | Status: DC
  Administered 2020-05-22 – 2020-05-23 (×3): 5000 [IU] via SUBCUTANEOUS
  Filled 2020-05-22 (×3): qty 1

## 2020-05-22 MED ORDER — DIPHENHYDRAMINE HCL 25 MG PO CAPS
50.0000 mg | ORAL_CAPSULE | Freq: Once | ORAL | Status: AC
  Administered 2020-05-23: 50 mg via ORAL
  Filled 2020-05-22: qty 2

## 2020-05-22 MED ORDER — ACETAMINOPHEN 325 MG PO TABS: 650.0000 mg | ORAL_TABLET | ORAL | Status: DC | PRN

## 2020-05-22 MED ORDER — ONDANSETRON HCL 4 MG/2ML IJ SOLN: 4.0000 mg | Freq: Four times a day (QID) | INTRAMUSCULAR | Status: DC | PRN

## 2020-05-22 MED ORDER — ASPIRIN 81 MG PO CHEW
324.0000 mg | CHEWABLE_TABLET | ORAL | Status: AC
  Administered 2020-05-22: 324 mg via ORAL
  Filled 2020-05-22: qty 4

## 2020-05-22 MED ORDER — SODIUM CHLORIDE 0.9 % IV SOLN: 250.0000 mL | INTRAVENOUS | Status: DC | PRN

## 2020-05-22 MED ORDER — DIPHENHYDRAMINE HCL 25 MG PO CAPS
50.0000 mg | ORAL_CAPSULE | Freq: Once | ORAL | Status: DC
  Filled 2020-05-22: qty 2

## 2020-05-22 MED ORDER — COENZYME Q10 100 MG PO CAPS: 1.0000 | ORAL_CAPSULE | Freq: Every day | ORAL | Status: DC

## 2020-05-22 MED ORDER — NITROGLYCERIN 2 % TD OINT
0.5000 [in_us] | TOPICAL_OINTMENT | Freq: Four times a day (QID) | TRANSDERMAL | Status: DC
  Administered 2020-05-22 – 2020-05-23 (×2): 0.5 [in_us] via TOPICAL
  Filled 2020-05-22 (×2): qty 1

## 2020-05-22 MED ORDER — ASPIRIN EC 81 MG PO TBEC
81.0000 mg | DELAYED_RELEASE_TABLET | Freq: Every day | ORAL | Status: DC
  Administered 2020-05-22: 81 mg via ORAL
  Filled 2020-05-22: qty 1

## 2020-05-22 MED ORDER — ATORVASTATIN CALCIUM 40 MG PO TABS
40.0000 mg | ORAL_TABLET | Freq: Every day | ORAL | Status: DC
  Administered 2020-05-22 – 2020-05-23 (×2): 40 mg via ORAL
  Filled 2020-05-22 (×2): qty 1

## 2020-05-22 MED ORDER — ASPIRIN 300 MG RE SUPP: 300.0000 mg | RECTAL | Status: AC

## 2020-05-22 MED ORDER — ADULT MULTIVITAMIN W/MINERALS CH
1.0000 | ORAL_TABLET | Freq: Every day | ORAL | Status: DC
  Administered 2020-05-22 – 2020-05-23 (×2): 1 via ORAL
  Filled 2020-05-22 (×2): qty 1

## 2020-05-22 MED ORDER — DIPHENHYDRAMINE HCL 50 MG/ML IJ SOLN: 50.0000 mg | Freq: Once | INTRAMUSCULAR | Status: AC

## 2020-05-22 MED ORDER — TIZANIDINE HCL 4 MG PO TABS
2.0000 mg | ORAL_TABLET | ORAL | Status: DC
Start: 2020-05-22 — End: 2020-05-22

## 2020-05-22 MED ORDER — SODIUM CHLORIDE 0.9% FLUSH: 3.0000 mL | Freq: Two times a day (BID) | INTRAVENOUS | Status: DC

## 2020-05-22 MED ORDER — TECHNETIUM TC 99M TETROFOSMIN IV KIT
33.0000 | PACK | Freq: Once | INTRAVENOUS | Status: AC | PRN
  Administered 2020-05-22: 33 via INTRAVENOUS
  Filled 2020-05-22: qty 33

## 2020-05-22 MED ORDER — VITAMIN D 25 MCG (1000 UNIT) PO TABS
2000.0000 [IU] | ORAL_TABLET | Freq: Every day | ORAL | Status: DC
  Administered 2020-05-22 – 2020-05-23 (×2): 2000 [IU] via ORAL
  Filled 2020-05-22 (×2): qty 2

## 2020-05-22 MED ORDER — DIPHENHYDRAMINE HCL 50 MG/ML IJ SOLN: 50.0000 mg | Freq: Once | INTRAMUSCULAR | Status: DC

## 2020-05-22 MED ORDER — SODIUM CHLORIDE 0.9% FLUSH: 3.0000 mL | INTRAVENOUS | Status: DC | PRN

## 2020-05-22 MED ORDER — NITROGLYCERIN 0.4 MG SL SUBL: 0.4000 mg | SUBLINGUAL_TABLET | SUBLINGUAL | Status: DC | PRN

## 2020-05-22 MED ORDER — TECHNETIUM TC 99M TETROFOSMIN IV KIT
11.0000 | PACK | Freq: Once | INTRAVENOUS | Status: AC | PRN
  Administered 2020-05-22: 11 via INTRAVENOUS
  Filled 2020-05-22: qty 11

## 2020-05-22 MED ORDER — GLUCOSAMINE-CHONDROITIN 250-200 MG PO TABS: ORAL_TABLET | Freq: Every day | ORAL | Status: DC

## 2020-05-22 MED ORDER — ASPIRIN 81 MG PO CHEW
81.0000 mg | CHEWABLE_TABLET | ORAL | Status: AC
  Administered 2020-05-23: 81 mg via ORAL
  Filled 2020-05-22: qty 1

## 2020-05-22 MED ORDER — ZINC 100 MG PO TABS: 1.0000 | ORAL_TABLET | Freq: Every day | ORAL | Status: DC

## 2020-05-22 NOTE — ED Triage Notes (Signed)
Patient arrives to ED via EMS with complaints of left sided chest pain and left arm numbness. Pt had cardiac stress test done today which showed ST segment depression during peak exercise. Pts MD worried for 3 vessel CAD. Pt states his left arm is still numb in his hands and hes having left sided of chest is tightness.

## 2020-05-22 NOTE — ED Notes (Signed)
Vincent  Mcbride  [place] [phone] [phone]

## 2020-05-22 NOTE — Telephone Encounter (Signed)
Patient presented to nuclear medicine today for Exercise Myoview for exertional CP.  During exercise he developed left sided CP with left arm numbness that persisted into recovery.  He developed > 77mm of horizontal ST segment depression in the inferolateral leads at peak exercise with downsloping ST segments in recovery. Nuclear images with LV dilatation and TID 1.27 and EF 46% worrisome for 3 vessel CAD.  Given persistent left arm numbness he will be transferred to [facility] ER by EMS for possible LHC later today.  This was discussed with Sharyne Peach and patient will be seen in ER by Card.
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
## 2020-05-22 NOTE — Progress Notes (Signed)
PHARMACIST - PHYSICIAN ORDER COMMUNICATION  CONCERNING: P&T Medication Policy on Herbal Medications  DESCRIPTION:  This patient's order for: Coenzyme Q10, glucosamine chondroitin, PreserVision/Lutein and zinc has been noted.  This product(s) is classified as an "herbal" or natural product. Due to a lack of definitive safety studies or FDA approval, nonstandard manufacturing practices, plus the potential risk of unknown drug-drug interactions while on inpatient medications, the Pharmacy and Therapeutics Committee does not permit the use of "herbal" or natural products of this type within [facility].   ACTION TAKEN: The pharmacy department is unable to verify this order at this time and your patient has been informed of this safety policy. Please reevaluate patient's clinical condition at discharge and address if the herbal or natural product(s) should be resumed at that time.  Shauna Hugh, PharmD, [place]  PGY-1 Pharmacy Resident 05/22/2020 8:13 PM  Please check [email or web address] for unit-specific pharmacy phone numbers.

## 2020-05-23 ENCOUNTER — Encounter (HOSPITAL_COMMUNITY): Payer: Medicare Other

## 2020-05-23 ENCOUNTER — Encounter (HOSPITAL_COMMUNITY): Admission: EM | Disposition: A | Discharge status: Home / Self Care | Payer: Self-pay | Source: Home / Self Care

## 2020-05-23 ENCOUNTER — Telehealth: Payer: Self-pay | Admitting: Cardiology

## 2020-05-23 ENCOUNTER — Other Ambulatory Visit: Payer: Self-pay

## 2020-05-23 ENCOUNTER — Telehealth: Payer: Self-pay

## 2020-05-23 DIAGNOSIS — I2510 Atherosclerotic heart disease of native coronary artery without angina pectoris: Secondary | ICD-10-CM

## 2020-05-23 DIAGNOSIS — Z9861 Coronary angioplasty status: Secondary | ICD-10-CM

## 2020-05-23 DIAGNOSIS — R9439 Abnormal result of other cardiovascular function study: Secondary | ICD-10-CM | POA: Diagnosis not present

## 2020-05-23 DIAGNOSIS — R079 Chest pain, unspecified: Secondary | ICD-10-CM | POA: Diagnosis not present

## 2020-05-23 DIAGNOSIS — E785 Hyperlipidemia, unspecified: Secondary | ICD-10-CM

## 2020-05-23 DIAGNOSIS — I10 Essential (primary) hypertension: Secondary | ICD-10-CM | POA: Diagnosis not present

## 2020-05-23 DIAGNOSIS — I25110 Atherosclerotic heart disease of native coronary artery with unstable angina pectoris: Secondary | ICD-10-CM | POA: Diagnosis not present

## 2020-05-23 HISTORY — DX: Hyperlipidemia, unspecified: E78.5

## 2020-05-23 HISTORY — DX: Coronary angioplasty status: Z98.61

## 2020-05-23 HISTORY — DX: Atherosclerotic heart disease of native coronary artery without angina pectoris: I25.10

## 2020-05-23 LAB — LIPID PANEL
Cholesterol: 118 mg/dL (ref 0–200)
HDL: 40 mg/dL — ABNORMAL LOW (ref >40)
LDL Cholesterol: 72 mg/dL (ref 0–99)
Total CHOL/HDL Ratio: 3 RATIO
Triglycerides: 28 mg/dL (ref <150)
VLDL: 6 mg/dL (ref 0–40)

## 2020-05-23 LAB — BASIC METABOLIC PANEL
Anion gap: 8 (ref 5–15)
BUN: 14 mg/dL (ref 8–23)
CO2: 24 mmol/L (ref 22–32)
Calcium: 9.2 mg/dL (ref 8.9–10.3)
Chloride: 107 mmol/L (ref 98–111)
Creatinine, Ser: 1 mg/dL (ref 0.61–1.24)
GFR calc Af Amer: >60 mL/min (ref >60)
GFR calc non Af Amer: >60 mL/min (ref >60)
Glucose, Bld: 140 mg/dL — ABNORMAL HIGH (ref 70–99)
Potassium: 4.3 mmol/L (ref 3.5–5.1)
Sodium: 139 mmol/L (ref 135–145)

## 2020-05-23 LAB — POCT ACTIVATED CLOTTING TIME
Activated Clotting Time: 246 seconds
Activated Clotting Time: 335 seconds
Activated Clotting Time: 285 seconds

## 2020-05-23 SURGERY — LEFT HEART CATH AND CORONARY ANGIOGRAPHY
Anesthesia: LOCAL

## 2020-05-23 MED ORDER — NITROGLYCERIN 1 MG/10 ML FOR IR/CATH LAB
INTRA_ARTERIAL | Status: AC
  Filled 2020-05-23: qty 10

## 2020-05-23 MED ORDER — LABETALOL HCL 5 MG/ML IV SOLN: 10.0000 mg | INTRAVENOUS | Status: DC | PRN

## 2020-05-23 MED ORDER — CLOPIDOGREL BISULFATE 75 MG PO TABS: 75.0000 mg | ORAL_TABLET | Freq: Every day | ORAL | 3 refills | Status: DC

## 2020-05-23 MED ORDER — SODIUM CHLORIDE 0.9 % IV SOLN: 250.0000 mL | INTRAVENOUS | Status: DC | PRN

## 2020-05-23 MED ORDER — FENTANYL CITRATE (PF) 100 MCG/2ML IJ SOLN
INTRAMUSCULAR | Status: DC | PRN
Start: 2020-05-23 — End: 2020-05-23
  Administered 2020-05-23 (×2): 25 ug via INTRAVENOUS

## 2020-05-23 MED ORDER — FENTANYL CITRATE (PF) 100 MCG/2ML IJ SOLN
INTRAMUSCULAR | Status: AC
  Filled 2020-05-23: qty 2

## 2020-05-23 MED ORDER — MORPHINE SULFATE (PF) 2 MG/ML IV SOLN: 2.0000 mg | INTRAVENOUS | Status: DC | PRN

## 2020-05-23 MED ORDER — HEPARIN SODIUM (PORCINE) 1000 UNIT/ML IJ SOLN
INTRAMUSCULAR | Status: AC
  Filled 2020-05-23: qty 1

## 2020-05-23 MED ORDER — SODIUM CHLORIDE 0.9 % IV SOLN: INTRAVENOUS | Status: DC

## 2020-05-23 MED ORDER — CLOPIDOGREL BISULFATE 75 MG PO TABS: 75.0000 mg | ORAL_TABLET | Freq: Every day | ORAL | Status: DC

## 2020-05-23 MED ORDER — HEPARIN (PORCINE) IN NACL 1000-0.9 UT/500ML-% IV SOLN
INTRAVENOUS | Status: AC
  Filled 2020-05-23: qty 1000

## 2020-05-23 MED ORDER — FAMOTIDINE IN NACL 20-0.9 MG/50ML-% IV SOLN
INTRAVENOUS | Status: AC | PRN
  Administered 2020-05-23: 20 mg via INTRAVENOUS

## 2020-05-23 MED ORDER — FAMOTIDINE IN NACL 20-0.9 MG/50ML-% IV SOLN
INTRAVENOUS | Status: AC
  Filled 2020-05-23: qty 50

## 2020-05-23 MED ORDER — ATORVASTATIN CALCIUM 40 MG PO TABS: 40.0000 mg | ORAL_TABLET | Freq: Every day | ORAL | 2 refills | Status: DC

## 2020-05-23 MED ORDER — VERAPAMIL HCL 2.5 MG/ML IV SOLN
INTRAVENOUS | Status: DC | PRN
  Administered 2020-05-23: 10 mL via INTRA_ARTERIAL

## 2020-05-23 MED ORDER — METOPROLOL TARTRATE 25 MG PO TABS: 25.0000 mg | ORAL_TABLET | Freq: Two times a day (BID) | ORAL | 2 refills | Status: DC

## 2020-05-23 MED ORDER — MIDAZOLAM HCL 2 MG/2ML IJ SOLN
INTRAMUSCULAR | Status: DC | PRN
  Administered 2020-05-23: 1 mg via INTRAVENOUS

## 2020-05-23 MED ORDER — HEPARIN (PORCINE) IN NACL 1000-0.9 UT/500ML-% IV SOLN
INTRAVENOUS | Status: DC | PRN
  Administered 2020-05-23 (×2): 500 mL

## 2020-05-23 MED ORDER — SODIUM CHLORIDE 0.9% FLUSH: 3.0000 mL | INTRAVENOUS | Status: DC | PRN

## 2020-05-23 MED ORDER — IOHEXOL 350 MG/ML SOLN
INTRAVENOUS | Status: DC | PRN
  Administered 2020-05-23: 120 mL via INTRA_ARTERIAL

## 2020-05-23 MED ORDER — CLOPIDOGREL BISULFATE 300 MG PO TABS
ORAL_TABLET | ORAL | Status: DC | PRN
  Administered 2020-05-23: 600 mg via ORAL

## 2020-05-23 MED ORDER — NITROGLYCERIN 1 MG/10 ML FOR IR/CATH LAB
INTRA_ARTERIAL | Status: DC | PRN
  Administered 2020-05-23 (×2): 200 ug via INTRACORONARY

## 2020-05-23 MED ORDER — MIDAZOLAM HCL 2 MG/2ML IJ SOLN
INTRAMUSCULAR | Status: AC
  Filled 2020-05-23: qty 2

## 2020-05-23 MED ORDER — LIDOCAINE HCL (PF) 1 % IJ SOLN
INTRAMUSCULAR | Status: DC | PRN
  Administered 2020-05-23: 2 mL via SUBCUTANEOUS

## 2020-05-23 MED ORDER — NITROGLYCERIN 0.4 MG SL SUBL: 0.4000 mg | SUBLINGUAL_TABLET | SUBLINGUAL | 0 refills | Status: DC | PRN

## 2020-05-23 MED ORDER — CLOPIDOGREL BISULFATE 300 MG PO TABS
ORAL_TABLET | ORAL | Status: AC
  Filled 2020-05-23: qty 2

## 2020-05-23 MED ORDER — SODIUM CHLORIDE 0.9% FLUSH: 3.0000 mL | Freq: Two times a day (BID) | INTRAVENOUS | Status: DC

## 2020-05-23 MED ORDER — HYDRALAZINE HCL 20 MG/ML IJ SOLN: 10.0000 mg | INTRAMUSCULAR | Status: DC | PRN

## 2020-05-23 MED ORDER — LIDOCAINE HCL (PF) 1 % IJ SOLN
INTRAMUSCULAR | Status: AC
  Filled 2020-05-23: qty 30

## 2020-05-23 MED ORDER — HEPARIN SODIUM (PORCINE) 1000 UNIT/ML IJ SOLN
INTRAMUSCULAR | Status: DC | PRN
  Administered 2020-05-23: 4500 [IU] via INTRAVENOUS
  Administered 2020-05-23: 5500 [IU] via INTRAVENOUS
  Administered 2020-05-23: 3000 [IU] via INTRAVENOUS

## 2020-05-23 MED FILL — NITROGLYCERIN 0.4 MG TAB SL: 0.4 | 7 days supply | Qty: 25 | Fill #0

## 2020-05-23 MED FILL — ATORVASTATIN CALCIUM 40 MG: 40 | 90 days supply | Qty: 90 | Fill #0

## 2020-05-23 MED FILL — METOPROLOL TARTRATE 25 MG T: 25 | 60 days supply | Qty: 120 | Fill #0

## 2020-05-23 MED FILL — CLOPIDOGREL 75 MG TABLET: 75 | 90 days supply | Qty: 90 | Fill #0

## 2020-05-23 SURGICAL SUPPLY — 22 items
BALLN SAPPHIRE 2.0X12 (BALLOONS) ×2
BALLN SAPPHIRE 2.5X15 (BALLOONS) ×2
BALLN SAPPHIRE ~~LOC~~ 3.0X10 (BALLOONS) ×2 IMPLANT
BALLN SAPPHIRE ~~LOC~~ 3.0X12 (BALLOONS) ×2 IMPLANT
BALLOON SAPPHIRE 2.0X12 (BALLOONS) ×1 IMPLANT
BALLOON SAPPHIRE 2.5X15 (BALLOONS) ×1 IMPLANT
CATH OPTITORQUE TIG 4.0 5F (CATHETERS) ×2 IMPLANT
CATH VISTA GUIDE 6FR XBLAD3.5 (CATHETERS) ×2 IMPLANT
DEVICE RAD COMP TR BAND LRG (VASCULAR PRODUCTS) ×2 IMPLANT
ELECT DEFIB PAD ADLT CADENCE (PAD) ×2 IMPLANT
GLIDESHEATH SLEND SS 6F .021 (SHEATH) ×2 IMPLANT
GUIDEWIRE INQWIRE 1.5J.035X260 (WIRE) ×1 IMPLANT
INQWIRE 1.5J .035X260CM (WIRE) ×2
KIT HEART LEFT (KITS) ×2 IMPLANT
PACK CARDIAC CATHETERIZATION (CUSTOM PROCEDURE TRAY) ×2 IMPLANT
STENT SYNERGY XD 2.50X20 (Permanent Stent) ×1 IMPLANT
STENT SYNERGY XD 2.50X24 (Permanent Stent) ×1 IMPLANT
SYNERGY XD 2.50X20 (Permanent Stent) ×2 IMPLANT
SYNERGY XD 2.50X24 (Permanent Stent) ×2 IMPLANT
TRANSDUCER W/STOPCOCK (MISCELLANEOUS) ×2 IMPLANT
TUBING CIL FLEX 10 FLL-RA (TUBING) ×2 IMPLANT
WIRE ASAHI PROWATER 180CM (WIRE) ×2 IMPLANT

## 2020-05-23 NOTE — Discharge Summary (Signed)
Discharge Summary    Patient ID: Vincent Mcbride MRN: [id number]; DOB: 07-24-1941  Admit date: 05/22/2020 Discharge date: 05/23/2020  Primary Care Provider: Hulan Fess, MD  Primary Cardiologist: Berniece Salines, DO   Discharge Diagnoses    Active Problems:   Chest pain   Abnormal nuclear stress test   CAD S/P percutaneous coronary angioplasty   HLD (hyperlipidemia)  Diagnostic Studies/Procedures    LHC 05/23/20:   The left ventricular systolic function is normal. The left ventricular ejection fraction is 55-65% by visual estimate. LV end diastolic pressure is normal.  There is no aortic valve stenosis. There is trivial (1+) mitral regurgitation.  --------------------------  Colon Flattery LAD to Prox LAD lesion is 5% stenosed. Moderate calcified  LESION #1: Mid LAD lesion is 85% stenosed.  A drug-eluting stent was successfully placed using a SYNERGY XD 2.50X24. Postdilated to 3.1 mm  --------------------------  Post intervention, there is a 0% residual stenosis.  LESION #2 prox Cx to Mid Cx lesion is 60% stenosed. Mid Cx lesion is 99% stenosed.  --------------------------  A drug-eluting stent was successfully placed covering both lesions, using a SYNERGY XD 2.50X20. Postdilated to 3.1 mm  Post intervention, there is a 0% residual stenosis.  Mid RCA lesion is 25% stenosed.  Mid RCA to Dist RCA lesion is 20% stenosed.   SUMMARY  Severe two-vessel disease with proximal-mid LCx diffuse 60% up to 99% focal stenosis, heavily calcified proximal LAD with mid LAD 80 to 85% segmental stenosis.  Diffusely diseased small first diagonal branch.  Otherwise normal RCA with minimal disease. ? Successful two-vessel PCI:   LAD 85% reduced to 0% using Synergy DES 2.5 mm x 20 mm--3.1 mm;  LCx 60-99% reduced to 0% using Synergy DES 2.5 mm x 24 mm--3.1 mm  Normal EF and EDP.   Very straightforward procedure.  No complications.  Patient was stable before during and after the procedure.   He was stable for same-day discharge today. He will be transferred from his ER overflow 5 Central bed via the PACU holding area to [place] where he will be managed until discharge.  Myoview stress test 05/22/20:   Nuclear stress EF: 46%.  Blood pressure demonstrated a normal response to exercise.  Horizontal ST segment depression ST segment depression of 2 mm was noted during stress in the II, III, aVF, V6, V5 and V4 leads, beginning at 4 minutes of stress, and returning to baseline after 5-9 minutes of recovery.  Defect 1: There is a medium defect of mild severity present in the basal inferior and mid inferior location.  The study is normal.  This is a low risk study.  The left ventricular ejection fraction is mildly decreased (45-54%).   There is mild global left ventricular hypokinesis. No reversible perfusion changes are seen. There is mild fixed diaphragmatic wall attenuation artifact. However, the left ventricular systolic function is abnormal and lower compared with echo performed just a few hours earlier. In view of the exercise induced symptoms and the marked and persistent ECG changes following exercise, "balanced ischemia" should be considered.  Echocardiogram 05/22/20:  1. Left ventricular ejection fraction, by estimation, is 55 to 60%. The  left ventricle has normal function. The left ventricle has no regional  wall motion abnormalities. Left ventricular diastolic parameters are  consistent with Grade I diastolic  dysfunction (impaired relaxation).  2. Right ventricular systolic function is normal. The right ventricular  size is normal. Tricuspid regurgitation signal is inadequate for assessing  PA pressure.  3.  Left atrial size was mildly dilated.  4. Right atrial size was mildly dilated.  5. The mitral valve is normal in structure. No evidence of mitral valve  regurgitation. No evidence of mitral stenosis.  6. The aortic valve is tricuspid. Aortic valve  regurgitation is trivial.  No aortic stenosis is present.  7. The inferior vena cava is normal in size with greater than 50%  respiratory variability, suggesting right atrial pressure of 3 mmHg.   History of Present Illness     Vincent Mcbride is a 79 y.o. male with a hx of hyperlipidemia and family history of coronary artery disease in his father and son who was seen by Dr. Harriet Masson 05/09/20 with a several month hx of exertional SOB, worsening fatigue and intermittent left-sided chest aching which would last for a few seconds prior to resolution. At times he would take aspirin with symptom resolution. He had recently seen his PCP for wellness checkup at which time an EKG was performed which was noted to be normal.   Plan was for OP stress test and echocardiogram which was found to be abnormal. He had recurrent chest pain during his test with 1-41mm upsloping ST depression in the inferior and lateral leads that slowly recovered  and he was therefore told to come to the ED with plans for LHC. He was started on Heparin infusion and treated appropriately for his dye allergy.   Hospital Course   LHC performed 05/23/20 showed severe two-vessel disease with proximal-mid LCx diffuse 60% up to 99% focal stenosis, heavily calcified proximal LAD with mid LAD 80 to 85% segmental stenosis.  Diffusely diseased small first diagonal branch.  Otherwise normal RCA with minimal disease. A successful two-vessel PCI to LAD 85% reduced to 0% using Synergy DES 2.5 mm x 20 mm--3.1 mm and LCx 60-99% reduced to 0% using Synergy DES 2.5 mm x 24 mm--3.1 mm was performed. Normal EF and EDP. Plan is for uninterrupted DAPT with ASA and Plavix for a minimum of 6 months. After 6 months, would stop aspirin and continue Plavix to complete 1 year (would be okay to interrupt for procedures). There were no post procedure complications with stable cath site. He was seen by cardiac rehabilitation with through cardiac education and ambulated  without anginal symptoms.   Currently on ASA, Plavix, metoprolol, atorvastatin, PRN NTG   Consultants: None   The patient was seen and examined by Dr. Ellyn Hack today who felt he is stable and ready for discharge today, 05/23/20.    Did the patient have an acute coronary syndrome (MI, NSTEMI, STEMI, etc) this admission?:  No                               Did the patient have a percutaneous coronary intervention (stent / angioplasty)?:  Yes.     Cath/PCI Registry Performance & Quality Measures: 1. Aspirin prescribed? - Yes 2. ADP Receptor Inhibitor (Plavix/Clopidogrel, Brilinta/Ticagrelor or Effient/Prasugrel) prescribed (includes medically managed patients)? - Yes 3. High Intensity Statin (Lipitor 40-80mg  or Crestor 20-40mg ) prescribed? - Yes 4. For EF <40%, was ACEI/ARB prescribed? - Not Applicable (EF >/= 19%) 5. For EF <40%, Aldosterone Antagonist (Spironolactone or Eplerenone) prescribed? - Not Applicable (EF >/= 62%) 6. Cardiac Rehab Phase II ordered? - Yes   _____________  Discharge Vitals Blood pressure (!) 129/53, pulse 83, temperature 98.6 F (37 C), temperature source Oral, resp. rate 18, height 5' 9.75" (1.772 m), weight 86.5  kg, SpO2 98 %.  Filed Weights   05/22/20 1240 05/22/20 2225  Weight: 88.5 kg 86.5 kg    Labs & Radiologic Studies    CBC Recent Labs    05/22/20 1255 05/22/20 1700  WBC 7.3 8.1  HGB 14.6 13.9  HCT 43.3 42.3  MCV 95.0 94.8  PLT 197 528   Basic Metabolic Panel Recent Labs    05/22/20 1255 05/22/20 1255 05/22/20 1700 05/23/20 0852  NA 141  --   --  139  K 4.1  --   --  4.3  CL 106  --   --  107  CO2 26  --   --  24  GLUCOSE 127*  --   --  140*  BUN 15  --   --  14  CREATININE 1.09   < > 1.07 1.00  CALCIUM 9.7  --   --  9.2   < > = values in this interval not displayed.   Liver Function Tests No results for input(s): AST, ALT, ALKPHOS, BILITOT, PROT, ALBUMIN in the last 72 hours. No results for input(s): LIPASE, AMYLASE in the  last 72 hours. High Sensitivity Troponin:   Recent Labs  Lab 05/22/20 1255 05/22/20 1700  TROPONINIHS 7 12    BNP Invalid input(s): POCBNP D-Dimer No results for input(s): DDIMER in the last 72 hours. Hemoglobin A1C No results for input(s): HGBA1C in the last 72 hours. Fasting Lipid Panel Recent Labs    05/23/20 0852  CHOL 118  HDL 40*  LDLCALC 72  TRIG 28  CHOLHDL 3.0   Thyroid Function Tests No results for input(s): TSH, T4TOTAL, T3FREE, THYROIDAB in the last 72 hours.  Invalid input(s): FREET3 _____________  DG Chest 2 View  Result Date: 05/22/2020 CLINICAL DATA:  79 year old male with left side chest pain, left arm numbness, abnormal cardiac stress test today. EXAM: CHEST - 2 VIEW COMPARISON:  [facility] chest radiographs 05/19/2012. FINDINGS: Lung volumes and mediastinal contours remain within normal limits. Visualized tracheal air column is within normal limits. Both lungs appear stable and clear. No pneumothorax or pleural effusion. Cholecystectomy clips. Negative visible bowel gas pattern. No acute osseous abnormality identified. IMPRESSION: No acute cardiopulmonary abnormality. Electronically Signed   By: Genevie Ann M.D.   On: 05/22/2020 13:19   CARDIAC CATHETERIZATION  Result Date: 05/23/2020  The left ventricular systolic function is normal. The left ventricular ejection fraction is 55-65% by visual estimate. LV end diastolic pressure is normal.  There is no aortic valve stenosis. There is trivial (1+) mitral regurgitation.  --------------------------  Colon Flattery LAD to Prox LAD lesion is 5% stenosed. Moderate calcified  LESION #1: Mid LAD lesion is 85% stenosed.  A drug-eluting stent was successfully placed using a SYNERGY XD 2.50X24. Postdilated to 3.1 mm  --------------------------  Post intervention, there is a 0% residual stenosis.  LESION #2 prox Cx to Mid Cx lesion is 60% stenosed. Mid Cx lesion is 99% stenosed.  --------------------------  A drug-eluting  stent was successfully placed covering both lesions, using a SYNERGY XD 2.50X20. Postdilated to 3.1 mm  Post intervention, there is a 0% residual stenosis.  Mid RCA lesion is 25% stenosed.  Mid RCA to Dist RCA lesion is 20% stenosed.  SUMMARY  Severe two-vessel disease with proximal-mid LCx diffuse 60% up to 99% focal stenosis, heavily calcified proximal LAD with mid LAD 80 to 85% segmental stenosis.  Diffusely diseased small first diagonal branch.  Otherwise normal RCA with minimal disease.  Successful two-vessel PCI:  LAD 85% reduced to 0% using Synergy DES 2.5 mm x 20 mm--3.1 mm;  LCx 60-99% reduced to 0% using Synergy DES 2.5 mm x 24 mm--3.1 mm  Normal EF and EDP. Very straightforward procedure.  No complications.  Patient was stable before during and after the procedure.  He was stable for same-day discharge today. He will be transferred from his ER overflow 5 Central bed via the PACU holding area to [place] where he will be managed until discharge. Glenetta Hew, MD  MYOCARDIAL PERFUSION IMAGING  Result Date: 05/22/2020  Nuclear stress EF: 46%.  Blood pressure demonstrated a normal response to exercise.  Horizontal ST segment depression ST segment depression of 2 mm was noted during stress in the II, III, aVF, V6, V5 and V4 leads, beginning at 4 minutes of stress, and returning to baseline after 5-9 minutes of recovery.  Defect 1: There is a medium defect of mild severity present in the basal inferior and mid inferior location.  The study is normal.  This is a low risk study.  The left ventricular ejection fraction is mildly decreased (45-54%).  There is mild global left ventricular hypokinesis. No reversible perfusion changes are seen. There is mild fixed diaphragmatic wall attenuation artifact. However, the left ventricular systolic function is abnormal and lower compared with echo performed just a few hours earlier. In view of the exercise induced symptoms and the marked and persistent  ECG changes following exercise, "balanced ischemia" should be considered.   ECHOCARDIOGRAM COMPLETE  Result Date: 05/22/2020    ECHOCARDIOGRAM REPORT   Patient Name:   Vincent Mcbride Date of Exam: 05/22/2020 Medical Rec #:  [id number]       Height:       69.8 in Accession #:    [id number]      Weight:       195.0 lb Date of Birth:  [birth year]       BSA:          2.060 m Patient Age:    63 years        BP:           130/60 mmHg Patient Gender: M               HR:           62 bpm. Exam Location:  [place] Procedure: 2D Echo, 3D Echo, Cardiac Doppler and Color Doppler Indications:    R07.9 Chest Pain                 R06.02 Shortness of Breath  History:        Patient has no prior history of Echocardiogram examinations.                 Signs/Symptoms:Chest Pain and Shortness of Breath; Risk                 Factors:Family History of Coronary Artery Disease. Fatigue.  Sonographer:    Deliah Boston RDCS Referring Phys: [id number] [place]  1. Left ventricular ejection fraction, by estimation, is 55 to 60%. The left ventricle has normal function. The left ventricle has no regional wall motion abnormalities. Left ventricular diastolic parameters are consistent with Grade I diastolic dysfunction (impaired relaxation).  2. Right ventricular systolic function is normal. The right ventricular size is normal. Tricuspid regurgitation signal is inadequate for assessing PA pressure.  3. Left atrial size was mildly dilated.  4. Right atrial size was mildly dilated.  5.  The mitral valve is normal in structure. No evidence of mitral valve regurgitation. No evidence of mitral stenosis.  6. The aortic valve is tricuspid. Aortic valve regurgitation is trivial. No aortic stenosis is present.  7. The inferior vena cava is normal in size with greater than 50% respiratory variability, suggesting right atrial pressure of 3 mmHg. FINDINGS  Left Ventricle: Left ventricular ejection fraction, by estimation, is 55 to 60%.  The left ventricle has normal function. The left ventricle has no regional wall motion abnormalities. The left ventricular internal cavity size was normal in size. There is  no left ventricular hypertrophy. Left ventricular diastolic parameters are consistent with Grade I diastolic dysfunction (impaired relaxation). Right Ventricle: The right ventricular size is normal. No increase in right ventricular wall thickness. Right ventricular systolic function is normal. Tricuspid regurgitation signal is inadequate for assessing PA pressure. Left Atrium: Left atrial size was mildly dilated. Right Atrium: Right atrial size was mildly dilated. Pericardium: There is no evidence of pericardial effusion. Mitral Valve: The mitral valve is normal in structure. No evidence of mitral valve regurgitation. No evidence of mitral valve stenosis. Tricuspid Valve: The tricuspid valve is normal in structure. Tricuspid valve regurgitation is not demonstrated. Aortic Valve: The aortic valve is tricuspid. Aortic valve regurgitation is trivial. No aortic stenosis is present. Pulmonic Valve: The pulmonic valve was normal in structure. Pulmonic valve regurgitation is trivial. Aorta: The aortic arch was not well visualized. Venous: The inferior vena cava is normal in size with greater than 50% respiratory variability, suggesting right atrial pressure of 3 mmHg. IAS/Shunts: No atrial level shunt detected by color flow Doppler.  LEFT VENTRICLE PLAX 2D LVIDd:         4.80 cm  Diastology LVIDs:         3.20 cm  LV e' lateral:   10.60 cm/s LV PW:         0.90 cm  LV E/e' lateral: 5.3 LV IVS:        0.80 cm  LV e' medial:    8.16 cm/s LVOT diam:     2.50 cm  LV E/e' medial:  6.9 LV SV:         95 LV SV Index:   46 LVOT Area:     4.91 cm                          3D Volume EF:                         3D EF:        66 %                         LV EDV:       191 ml                         LV ESV:       64 ml                         LV SV:        126 ml  RIGHT VENTRICLE RV S prime:     13.40 cm/s TAPSE (M-mode): 2.2 cm LEFT ATRIUM              Index       RIGHT ATRIUM  Index LA diam:        4.50 cm  2.18 cm/m  RA Area:     20.80 cm LA Vol (A2C):   101.0 ml 49.03 ml/m RA Volume:   69.30 ml  33.64 ml/m LA Vol (A4C):   62.9 ml  30.54 ml/m LA Biplane Vol: 81.8 ml  39.71 ml/m  AORTIC VALVE LVOT Vmax:   77.35 cm/s LVOT Vmean:  49.800 cm/s LVOT VTI:    0.193 m  AORTA Ao Root diam: 3.70 cm Ao Asc diam:  3.50 cm MITRAL VALVE MV Area (PHT): cm         SHUNTS MV Decel Time: 446 msec    Systemic VTI:  0.19 m MV E velocity: 55.95 cm/s  Systemic Diam: 2.50 cm MV A velocity: 63.20 cm/s MV E/A ratio:  0.89 Loralie Champagne MD Electronically signed by Loralie Champagne MD Signature Date/Time: 05/22/2020/2:00:25 PM    Final    Disposition   Pt is being discharged home today in good condition.  Follow-up Plans & Appointments    Follow-up Information    Tobb, Godfrey Pick, DO Follow up on 06/08/2020.   Specialty: Cardiology Why: at [facility] information: [place] [address] [phone]              Discharge Instructions    Amb Referral to Cardiac Rehabilitation   Complete by: As directed    Diagnosis: Coronary Stents   After initial evaluation and assessments completed: Virtual Based Care may be provided alone or in conjunction with Phase 2 Cardiac Rehab based on patient barriers.: Yes   Call MD for:  difficulty breathing, headache or visual disturbances   Complete by: As directed    Call MD for:  extreme fatigue   Complete by: As directed    Call MD for:  hives   Complete by: As directed    Call MD for:  persistant dizziness or light-headedness   Complete by: As directed    Call MD for:  persistant nausea and vomiting   Complete by: As directed    Call MD for:  redness, tenderness, or signs of infection (pain, swelling, redness, odor or green/yellow discharge around incision site)   Complete by: As directed    Call MD for:   severe uncontrolled pain   Complete by: As directed    Call MD for:  temperature >100.4   Complete by: As directed    Diet - low sodium heart healthy   Complete by: As directed    Discharge instructions   Complete by: As directed    PLEASE DO NOT Admire!!!!! Also keep a log of you blood pressures and bring back to your follow up appt. Please call the office with any questions.   Patients taking blood thinners should generally stay away from medicines like ibuprofen, Advil, Motrin, naproxen, and Aleve due to risk of stomach bleeding. You may take Tylenol as directed or talk to your primary doctor about alternatives.  Some studies suggest Prilosec/Omeprazole interacts with Plavix. If you have reflux symptoms, please use Protonix for less chance of interaction.   No driving for 3-5 days. No lifting over 5 lbs for 1 week. No sexual activity for 1 week. Keep procedure site clean & dry. If you notice increased pain, swelling, bleeding or pus, call/return!  You may shower, but no soaking baths/hot tubs/pools for 1 week.   Increase activity slowly   Complete by: As directed  Discharge Medications   Allergies as of 05/23/2020      Reactions   Contrast Media [iodinated Diagnostic Agents] Swelling, Other (See Comments)   IVP Contrast- "Made me feel strange and tight in the throat"   Penicillins Other (See Comments)   Reaction was approx 40 years ago- Received a shot and it caused severe bruising      Medication List    TAKE these medications   acetaminophen 500 MG tablet Commonly known as: TYLENOL Take 500 mg by mouth every 6 (six) hours as needed (for severe headaches).   aspirin EC 81 MG tablet Take 81 mg by mouth every other day.   atorvastatin 40 MG tablet Commonly known as: LIPITOR Take 1 tablet (40 mg total) by mouth daily. Start taking on: May 24, 2020   Benefiber Powd Take by mouth See admin instructions. Mix 1 teaspoonful into the morning cup  of coffee and drink   clopidogrel 75 MG tablet Commonly known as: PLAVIX Take 1 tablet (75 mg total) by mouth daily with breakfast. Start taking on: May 24, 2020   Coenzyme Q10 100 MG capsule Take 100 mg by mouth daily.   fexofenadine 180 MG tablet Commonly known as: ALLEGRA Take 180 mg by mouth daily.   Horse Chestnut 300 MG Caps Take 300 mg by mouth daily.   metoprolol tartrate 25 MG tablet Commonly known as: LOPRESSOR Take 1 tablet (25 mg total) by mouth 2 (two) times daily.   MULTIVITAMIN PO Take 1 tablet by mouth daily with breakfast.   nitroGLYCERIN 0.4 MG SL tablet Commonly known as: NITROSTAT Place 1 tablet (0.4 mg total) under the tongue every 5 (five) minutes x 3 doses as needed for chest pain. What changed: when to take this   OSTEO BI-FLEX REGULAR STRENGTH PO Take 1 tablet by mouth daily.   Emergen-C Vitamin C Pack Take 1 packet by mouth daily as needed (to boost the immune system).   PRESERVISION/LUTEIN PO Take 1 capsule by mouth daily.   Red Yeast Rice 600 MG Caps Take 600 mg by mouth daily.   tadalafil 20 MG tablet Commonly known as: CIALIS Take 20 mg by mouth daily as needed for erectile dysfunction.   tiZANidine 2 MG tablet Commonly known as: ZANAFLEX Take 2-4 mg by mouth See admin instructions. 1-3 times a day as needed for spasms   VITAMIN B-12 PO Take 1 tablet by mouth daily as needed (to boost energy).   Vitamin D3 50 MCG (2000 UT) Tabs Take 2,000 Units by mouth daily.   Zinc 100 MG Tabs Take 1 tablet by mouth daily.       Outstanding Labs/Studies   Lipid panel and LFTs in 6-8 weeks   Duration of Discharge Encounter   Greater than 30 minutes including physician time.  Signed, Kathyrn Drown, NP 05/23/2020, 4:59 PM

## 2020-05-23 NOTE — Telephone Encounter (Signed)
Left message on patients voicemail to please return our call.   

## 2020-05-23 NOTE — Progress Notes (Signed)
[id number] Discussed with pt the importance of plavix with stent. Reviewed NTG use, heart healthy food choices, walking for exercise and CRP 2. Pt stated his son just completed program. Referred to [facility] [place] program. Pt voiced understanding. Graylon Good RN BSN 05/23/2020 2:13 PM

## 2020-05-23 NOTE — Progress Notes (Signed)
Progress Note  Patient Name: Vincent Mcbride Date of Encounter: 05/23/2020  [facility] Cardiologist: Godfrey Pick Tobb, DO   Subjective   No CP  No SOB   Inpatient Medications    Scheduled Meds: . [START ON 05/24/2020] aspirin EC  81 mg Oral Daily  . atorvastatin  40 mg Oral Daily  . cholecalciferol  2,000 Units Oral Daily  . diphenhydrAMINE  50 mg Oral Once   Or  . diphenhydrAMINE  50 mg Intravenous Once  . heparin  5,000 Units Subcutaneous Q8H  . loratadine  10 mg Oral Daily  . metoprolol tartrate  25 mg Oral BID  . multivitamin with minerals  1 tablet Oral Daily  . nitroGLYCERIN  0.5 inch Topical Q6H  . predniSONE  50 mg Oral Q6H  . sodium chloride flush  3 mL Intravenous Q12H   Continuous Infusions: . sodium chloride    . sodium chloride 75 mL/hr at 05/22/20 2307   PRN Meds: sodium chloride, acetaminophen, nitroGLYCERIN, ondansetron (ZOFRAN) IV, sodium chloride flush   Vital Signs    Vitals:   05/22/20 2045 05/22/20 2201 05/22/20 2225 05/23/20 0500  BP: (!) 144/70 132/66 135/63 112/64  Pulse: (!) 59 (!) 58 (!) 55 62  Resp: 13 19 18 18   Temp:  98.5 F (36.9 C) 98.7 F (37.1 C) 98.6 F (37 C)  TempSrc:  Oral Oral Oral  SpO2: 99% 100% 100% 96%  Weight:   86.5 kg   Height:       No intake or output data in the 24 hours ending 05/23/20 0738 Last 3 Weights 05/22/2020 05/22/2020 05/22/2020  Weight (lbs) 190 lb 11.2 oz 195 lb 195 lb  Weight (kg) 86.5 kg 88.451 kg 88.451 kg      Telemetry    SR - Personally Reviewed  ECG    No new  Personally Reviewed  Physical Exam   GEN: No acute distress.   Neck: No JVD Cardiac: RRR, no murmurs, rubs, or gallops.  Respiratory: Clear to auscultation bilaterally. GI: Soft, nontender, non-distended  MS: No edema; No deformity. Neuro:  Nonfocal  Psych: Normal affect   Labs    High Sensitivity Troponin:   Recent Labs  Lab 05/22/20 1255 05/22/20 1700  TROPONINIHS 7 12      Chemistry Recent Labs  Lab  05/22/20 1255 05/22/20 1700  NA 141  --   K 4.1  --   CL 106  --   CO2 26  --   GLUCOSE 127*  --   BUN 15  --   CREATININE 1.09 1.07  CALCIUM 9.7  --   GFRNONAA >60 >60  GFRAA >60 >60  ANIONGAP 9  --      Hematology Recent Labs  Lab 05/22/20 1255 05/22/20 1700  WBC 7.3 8.1  RBC 4.56 4.46  HGB 14.6 13.9  HCT 43.3 42.3  MCV 95.0 94.8  MCH 32.0 31.2  MCHC 33.7 32.9  RDW 12.9 12.7  PLT 197 215    BNPNo results for input(s): BNP, PROBNP in the last 168 hours.   DDimer No results for input(s): DDIMER in the last 168 hours.   Radiology    DG Chest 2 View  Result Date: 05/22/2020 CLINICAL DATA:  79 year old male with left side chest pain, left arm numbness, abnormal cardiac stress test today. EXAM: CHEST - 2 VIEW COMPARISON:  [facility] chest radiographs 05/19/2012. FINDINGS: Lung volumes and mediastinal contours remain within normal limits. Visualized tracheal air column is within normal  limits. Both lungs appear stable and clear. No pneumothorax or pleural effusion. Cholecystectomy clips. Negative visible bowel gas pattern. No acute osseous abnormality identified. IMPRESSION: No acute cardiopulmonary abnormality. Electronically Signed   By: Genevie Ann M.D.   On: 05/22/2020 13:19   MYOCARDIAL PERFUSION IMAGING  Result Date: 05/22/2020  Nuclear stress EF: 46%.  Blood pressure demonstrated a normal response to exercise.  Horizontal ST segment depression ST segment depression of 2 mm was noted during stress in the II, III, aVF, V6, V5 and V4 leads, beginning at 4 minutes of stress, and returning to baseline after 5-9 minutes of recovery.  Defect 1: There is a medium defect of mild severity present in the basal inferior and mid inferior location.  The study is normal.  This is a low risk study.  The left ventricular ejection fraction is mildly decreased (45-54%).  There is mild global left ventricular hypokinesis. No reversible perfusion changes are seen. There is mild fixed  diaphragmatic wall attenuation artifact. However, the left ventricular systolic function is abnormal and lower compared with echo performed just a few hours earlier. In view of the exercise induced symptoms and the marked and persistent ECG changes following exercise, "balanced ischemia" should be considered.   ECHOCARDIOGRAM COMPLETE  Result Date: 05/22/2020    ECHOCARDIOGRAM REPORT   Patient Name:   Vincent Mcbride Date of Exam: 05/22/2020 Medical Rec #:  [id number]       Height:       69.8 in Accession #:    [id number]      Weight:       195.0 lb Date of Birth:  1941-09-05       BSA:          2.060 m Patient Age:    79 years        BP:           130/60 mmHg Patient Gender: M               HR:           62 bpm. Exam Location:  [facility] Procedure: 2D Echo, 3D Echo, Cardiac Doppler and Color Doppler Indications:    R07.9 Chest Pain                 R06.02 Shortness of Breath  History:        Patient has no prior history of Echocardiogram examinations.                 Signs/Symptoms:Chest Pain and Shortness of Breath; Risk                 Factors:Family History of Coronary Artery Disease. Fatigue.  Sonographer:    Deliah Boston RDCS Referring Phys: [id number] [place]  1. Left ventricular ejection fraction, by estimation, is 55 to 60%. The left ventricle has normal function. The left ventricle has no regional wall motion abnormalities. Left ventricular diastolic parameters are consistent with Grade I diastolic dysfunction (impaired relaxation).  2. Right ventricular systolic function is normal. The right ventricular size is normal. Tricuspid regurgitation signal is inadequate for assessing PA pressure.  3. Left atrial size was mildly dilated.  4. Right atrial size was mildly dilated.  5. The mitral valve is normal in structure. No evidence of mitral valve regurgitation. No evidence of mitral stenosis.  6. The aortic valve is tricuspid. Aortic valve regurgitation is trivial. No aortic stenosis is  present.  7. The inferior vena cava is  normal in size with greater than 50% respiratory variability, suggesting right atrial pressure of 3 mmHg. FINDINGS  Left Ventricle: Left ventricular ejection fraction, by estimation, is 55 to 60%. The left ventricle has normal function. The left ventricle has no regional wall motion abnormalities. The left ventricular internal cavity size was normal in size. There is  no left ventricular hypertrophy. Left ventricular diastolic parameters are consistent with Grade I diastolic dysfunction (impaired relaxation). Right Ventricle: The right ventricular size is normal. No increase in right ventricular wall thickness. Right ventricular systolic function is normal. Tricuspid regurgitation signal is inadequate for assessing PA pressure. Left Atrium: Left atrial size was mildly dilated. Right Atrium: Right atrial size was mildly dilated. Pericardium: There is no evidence of pericardial effusion. Mitral Valve: The mitral valve is normal in structure. No evidence of mitral valve regurgitation. No evidence of mitral valve stenosis. Tricuspid Valve: The tricuspid valve is normal in structure. Tricuspid valve regurgitation is not demonstrated. Aortic Valve: The aortic valve is tricuspid. Aortic valve regurgitation is trivial. No aortic stenosis is present. Pulmonic Valve: The pulmonic valve was normal in structure. Pulmonic valve regurgitation is trivial. Aorta: The aortic arch was not well visualized. Venous: The inferior vena cava is normal in size with greater than 50% respiratory variability, suggesting right atrial pressure of 3 mmHg. IAS/Shunts: No atrial level shunt detected by color flow Doppler.  LEFT VENTRICLE PLAX 2D LVIDd:         4.80 cm  Diastology LVIDs:         3.20 cm  LV e' lateral:   10.60 cm/s LV PW:         0.90 cm  LV E/e' lateral: 5.3 LV IVS:        0.80 cm  LV e' medial:    8.16 cm/s LVOT diam:     2.50 cm  LV E/e' medial:  6.9 LV SV:         95 LV SV Index:   46 LVOT  Area:     4.91 cm                          3D Volume EF:                         3D EF:        66 %                         LV EDV:       191 ml                         LV ESV:       64 ml                         LV SV:        126 ml RIGHT VENTRICLE RV S prime:     13.40 cm/s TAPSE (M-mode): 2.2 cm LEFT ATRIUM              Index       RIGHT ATRIUM           Index LA diam:        4.50 cm  2.18 cm/m  RA Area:     20.80 cm LA Vol (A2C):   101.0 ml 49.03 ml/m RA Volume:  69.30 ml  33.64 ml/m LA Vol (A4C):   62.9 ml  30.54 ml/m LA Biplane Vol: 81.8 ml  39.71 ml/m  AORTIC VALVE LVOT Vmax:   77.35 cm/s LVOT Vmean:  49.800 cm/s LVOT VTI:    0.193 m  AORTA Ao Root diam: 3.70 cm Ao Asc diam:  3.50 cm MITRAL VALVE MV Area (PHT): cm         SHUNTS MV Decel Time: 446 msec    Systemic VTI:  0.19 m MV E velocity: 55.95 cm/s  Systemic Diam: 2.50 cm MV A velocity: 63.20 cm/s MV E/A ratio:  0.89 Loralie Champagne MD Electronically signed by Loralie Champagne MD Signature Date/Time: 05/22/2020/2:00:25 PM    Final     Cardiac Studies    Nuclear stress EF: 46%.  Blood pressure demonstrated a normal response to exercise.  Horizontal ST segment depression ST segment depression of 2 mm was noted during stress in the II, III, aVF, V6, V5 and V4 leads, beginning at 4 minutes of stress, and returning to baseline after 5-9 minutes of recovery.  Defect 1: There is a medium defect of mild severity present in the basal inferior and mid inferior location.  The study is normal.  This is a low risk study.  The left ventricular ejection fraction is mildly decreased (45-54%).   There is mild global left ventricular hypokinesis. No reversible perfusion changes are seen. There is mild fixed diaphragmatic wall attenuation artifact. However, the left ventricular systolic function is abnormal and lower compared with echo performed just a few hours earlier. In view of the exercise induced symptoms and the marked and persistent ECG  changes following exercise, "balanced ischemia" should be considered.   Patient Profile     79 y.o. male with a hix of CP and SOB with abnormal stress myovue admitted for further evaluation / cath.  Assessment & Plan    1  CP / DOE   Pt seen in clnic recently  Had stress myovue yesterday which was abnormal    Admitted for [facility] cath today REmains pain free at rest (pt did says his son has had a stent)  2  Lipids   Lipid level pending  He had been on red yeast rice until yesterday  I moved to statin.   For questions or updates, please contact [place] Please consult [email or web address] for contact info under        Signed, Dorris Carnes, MD  05/23/2020, 7:38 AM

## 2020-05-23 NOTE — Telephone Encounter (Signed)
I was able to speak with the patient. He had a LHC today with status post PCI. Please keep his 06/08/20 appointment.

## 2020-05-23 NOTE — Interval H&P Note (Signed)
History and Physical Interval Note:  05/23/2020 10:35 AM  Vincent Mcbride  has presented today for surgery, with the diagnosis of abnormal stress.  The various methods of treatment have been discussed with the patient and family. After consideration of risks, benefits and other options for treatment, the patient has consented to  Procedure(s): LEFT HEART CATH AND CORONARY ANGIOGRAPHY (N/A)  PERCUTANEOUS CORONARY INTERVENTION  as a surgical intervention.  The patient's history has been reviewed, patient examined, no change in status, stable for surgery.  I have reviewed the patient's chart and labs.  Questions were answered to the patient's satisfaction.    Cath Lab Visit (complete for each Cath Lab visit)  Clinical Evaluation Leading to the Procedure:   ACS: No.  Non-ACS:    Anginal Classification: CCS II  Anti-ischemic medical therapy: Minimal Therapy (1 class of medications)  Non-Invasive Test Results: High-risk stress test findings: cardiac mortality >3%/year  Prior CABG: No previous CABG   Glenetta Hew

## 2020-05-23 NOTE — Discharge Instructions (Signed)
Coronary Angiogram With Stent, Care After This sheet gives you information about how to care for yourself after your procedure. Your health care provider may also give you more specific instructions. If you have problems or questions, contact your health care provider. What can I expect after the procedure? After the procedure, it is common to have:  Bruising and tenderness at the insertion site. This usually fades within 1-2 weeks.  A collection of blood under the skin (hematoma). This usually decreases within 1-2 weeks. Follow these instructions at home: Medicines  Take over-the-counter and prescription medicines only as told by your health care provider.  If you were prescribed an antibiotic medicine, take it as told by your health care provider. Do not stop using the antibiotic even if you start to feel better.  If you take medicines for diabetes, your health care provider may need to change how much you take. Ask your health care provider for specific directions about taking your diabetes medicines.  If you are taking blood thinners: ? Talk with your health care provider before you take any medicines that contain aspirin or NSAIDs, such as ibuprofen. These medicines increase your risk for dangerous bleeding. ? Take your medicine exactly as told, at the same time every day. ? Avoid activities that could cause injury or bruising, and follow instructions about how to prevent falls. ? Wear a medical alert bracelet or carry a card that lists what medicines you take. Eating and drinking   Follow instructions from your health care provider about eating or drinking restrictions.  Eat a heart-healthy diet that includes plenty of fresh fruits and vegetables.  Avoid foods that are high in salt, sugar, or saturated fat. Avoid fried foods or canned or highly processed food.  Drink enough fluid to keep your urine pale yellow. Alcohol use  Do not drink alcohol if: ? Your health care provider  tells you not to. ? You are pregnant, may be pregnant, or plan to become pregnant.  If you drink alcohol: ? Limit how much you use to:  0-1 drink a day for women.  0-2 drinks a day for men. ? Be aware of how much alcohol is in your drink. In the [place] one drink equals one 12 oz bottle of beer (355 mL), one 5 oz glass of wine (148 mL), or one 1 oz glass of hard liquor (44 mL). Bathing  Do not take baths, swim, or use a hot tub until your health care provider approves. Ask your health care provider if you may take showers. You may only be allowed to take sponge baths.  Gently wash the insertion site with plain soap and water.  Pat the area dry with a clean towel. Do not rub. This may cause bleeding. Incision care  Follow instructions from your health care provider about how to take care of your insertion area. Make sure you: ? Wash your hands with soap and water before and after you change your bandage (dressing). If soap and water are not available, use hand sanitizer. ? Change your dressing as told by your health care provider. ? Leave stitches (sutures) or adhesive strips in place. These skin closures may need to stay in place for 2 weeks or longer. If adhesive strip edges start to loosen and curl up, you may trim the loose edges. Do not remove adhesive strips completely unless your health care provider tells you to do that.  Do not apply powder or lotion on the insertion area.  Check   your insertion area every day for signs of infection. Check for: ? Redness, swelling, or pain. ? Fluid or blood. ? Warmth. ? Pus or a bad smell. Activity  Do not drive for 24 hours if you were given a sedative during your procedure.  Rest as told by your health care provider. ? Avoid sitting for a long time without moving. Get up to take short walks every 1-2 hours. This is important to improve blood flow and breathing. Ask for help if you feel weak or unsteady.  Do not lift anything that is  heavier than 10 lb (4.5 kg), or the limit that you are told, until your health care provider says that it is safe.  Return to your normal activities as told by your health care provider. Ask your health care provider what activities are safe for you. Lifestyle   Do not use any products that contain nicotine or tobacco, such as cigarettes, e-cigarettes, and chewing tobacco. If you need help quitting, ask your health care provider.  If needed, work with your health care provider to treat other problems, such as being overweight, or having high blood pressure or diabetes.  Get regular exercise. Do exercises as told by your health care provider. General instructions  Tell all your health care providers that you have a stent. This is especially important if you are going to get imaging studies, such as MRI.  Wear compression stockings as told by your health care provider. These stockings help to prevent blood clots and reduce swelling in your legs.  Do not strain during a bowel movement if the procedure was done through your leg. Straining may cause bleeding from the insertion site.  Keep all follow-up visits as directed by your health care provider. This is important. Contact a health care provider if you:  Have a fever.  Have chills.  Have redness, swelling, or pain around your insertion area.  Have fluid or blood (other than a little blood on the dressing) coming from your insertion area.  Notice that your insertion area feels warm to the touch.  Have pus or a bad smell coming from your insertion area.  Have more bleeding from the insertion area. Hold pressure on the area. Get help right away if:  You develop chest pain or shortness of breath.  You feel like fainting or you faint.  Your leg or arm becomes cool, numb, or tingly.  You have unusual pain.  Your insertion area is bleeding, and bleeding continues after 30 minutes of steadily held pressure.  You develop bleeding  anywhere else, including from your rectum. There may be bright red blood in your urine or stool, or you may have black, tarry stool. These symptoms may represent a serious problem that is an emergency. Do not wait to see if the symptoms will go away. Get medical help right away. Call your local emergency services (911 in the [place] Do not drive yourself to the hospital. Summary  After this procedure, it is common to have bruising and tenderness around the catheter insertion site. This will go away in a few weeks.  Follow your health care provider's instructions about caring for your insertion site. Change dressing and clean the area as instructed.  Eat a heart-healthy diet. Limit alcohol use. Do not use tobacco or nicotine.  Contact a health care provider if you have fever or chills, or if you have pus or a bad smell coming from the site.  Get help right away if you   develop chest pain, you faint, or have bleeding at the insertion site. This information is not intended to replace advice given to you by your health care provider. Make sure you discuss any questions you have with your health care provider. Document Revised: 04/06/2019 Document Reviewed: 04/06/2019 Elsevier Patient Education  2020 [employer]  

## 2020-05-23 NOTE — Telephone Encounter (Signed)
-----   Message from Berniece Salines, DO sent at 05/23/2020 10:20 AM EDT -----   I will like to speak to the patient about his stress test results.  Please see if the patient can see me in [place].  If not have not set for my 1st day back at [facility].

## 2020-05-24 ENCOUNTER — Encounter (HOSPITAL_COMMUNITY): Payer: Self-pay | Admitting: Cardiology

## 2020-05-25 ENCOUNTER — Telehealth (HOSPITAL_COMMUNITY): Payer: Self-pay

## 2020-05-25 NOTE — Telephone Encounter (Signed)
Called patient to see if he is interested in the Cardiac Rehab Program. Patient expressed interest. Explained scheduling process and went over insurance, patient verbalized understanding. Will contact patient for scheduling once f/u has been completed.  °

## 2020-05-25 NOTE — Telephone Encounter (Signed)
Pt insurance is active and benefits verified through [facility]. Co-pay $10.00, DED $0.00/$0.00 met, out of pocket $900.00/$50.00 met, co-insurance 0%. No pre-authorization required. Passport, 05/25/20 @ 2:03PM, TVV#[id number]  Will contact patient to see if he is interested in the Cardiac Rehab Program. If interested, patient will need to complete follow up appt. Once completed, patient will be contacted for scheduling upon review by the RN Navigator.

## 2020-05-29 ENCOUNTER — Encounter: Payer: Self-pay | Admitting: Cardiology

## 2020-05-29 ENCOUNTER — Other Ambulatory Visit: Payer: Self-pay

## 2020-05-29 ENCOUNTER — Ambulatory Visit: Payer: Medicare Other | Admitting: Cardiology

## 2020-05-29 ENCOUNTER — Telehealth: Payer: Self-pay | Admitting: Cardiology

## 2020-05-29 ENCOUNTER — Ambulatory Visit (INDEPENDENT_AMBULATORY_CARE_PROVIDER_SITE_OTHER): Payer: Medicare Other

## 2020-05-29 VITALS — BP 110/62 | HR 67 | Ht 70.0 in | Wt 190.2 lb

## 2020-05-29 DIAGNOSIS — I493 Ventricular premature depolarization: Secondary | ICD-10-CM | POA: Diagnosis not present

## 2020-05-29 DIAGNOSIS — Z9861 Coronary angioplasty status: Secondary | ICD-10-CM

## 2020-05-29 DIAGNOSIS — I10 Essential (primary) hypertension: Secondary | ICD-10-CM

## 2020-05-29 DIAGNOSIS — E782 Mixed hyperlipidemia: Secondary | ICD-10-CM

## 2020-05-29 DIAGNOSIS — I2510 Atherosclerotic heart disease of native coronary artery without angina pectoris: Secondary | ICD-10-CM

## 2020-05-29 HISTORY — DX: Essential (primary) hypertension: I10

## 2020-05-29 MED ORDER — AMIODARONE HCL 200 MG PO TABS: 200.0000 mg | ORAL_TABLET | Freq: Every day | ORAL | 1 refills | Status: DC

## 2020-05-29 NOTE — Patient Instructions (Addendum)
Medication Instructions:    START TAKING AMIODARONE 200 MG TWICE FOR 7 DAYS    THEN RESUME  AMIODARONE 200 MG ONCE A DAY    *If you need a refill on your cardiac medications before your next appointment, please call your pharmacy*   Lab Work:  BMET  MAG CBC TSH AND LFT    If you have labs (blood work) drawn today and your tests are completely normal, you will receive your results only by: Marland Kitchen MyChart Message (if you have MyChart) OR . A paper copy in the mail If you have any lab test that is abnormal or we need to change your treatment, we will call you to review the results.   Testing/Procedures: Your physician has recommended that you wear an event monitor. Event monitors are medical devices that record the heart's electrical activity. Doctors most often [place] these monitors to diagnose arrhythmias. Arrhythmias are problems with the speed or rhythm of the heartbeat. The monitor is a small, portable device. You can wear one while you do your normal daily activities. This is usually used to diagnose what is causing palpitations/syncope (passing out).   Follow-Up: At [facility], you and your health needs are our priority.  As part of our continuing mission to provide you with exceptional heart care, we have created designated Provider Care Teams.  These Care Teams include your primary Cardiologist (physician) and Advanced Practice Providers (APPs -  Physician Assistants and Nurse Practitioners) who all work together to provide you with the care you need, when you need it.  We recommend signing up for the patient portal called "MyChart".  Sign up information is provided on this After Visit Summary.  MyChart is used to connect with patients for Virtual Visits (Telemedicine).  Patients are able to view lab/test results, encounter notes, upcoming appointments, etc.  Non-urgent messages can be sent to your provider as well.   To learn more about what you can do with MyChart, go to  [email or web address].    Your next appointment:   KEEP APPOINTMENT AS SCHEDULED     Other Instructions
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
## 2020-05-29 NOTE — Telephone Encounter (Signed)
Pt states that he noticed his heart rate has been 35 and 36 with BP 110/58. Pt agreed to be seen at [facility] today as opposed to going to the hospital. Dr. Harriet Masson advised that she wanted to see pt today.

## 2020-05-30 ENCOUNTER — Telehealth: Payer: Self-pay

## 2020-05-30 LAB — BASIC METABOLIC PANEL
BUN/Creatinine Ratio: 18 (ref 10–24)
BUN: 22 mg/dL (ref 8–27)
CO2: 24 mmol/L (ref 20–29)
Calcium: 9.7 mg/dL (ref 8.6–10.2)
Chloride: 105 mmol/L (ref 96–106)
Creatinine, Ser: 1.19 mg/dL (ref 0.76–1.27)
GFR calc Af Amer: 67 mL/min/{1.73_m2} (ref >59)
GFR calc non Af Amer: 58 mL/min/{1.73_m2} — ABNORMAL LOW (ref >59)
Glucose: 97 mg/dL (ref 65–99)
Potassium: 4.8 mmol/L (ref 3.5–5.2)
Sodium: 141 mmol/L (ref 134–144)

## 2020-05-30 LAB — CBC
Hematocrit: 41.9 % (ref 37.5–51.0)
Hemoglobin: 14.6 g/dL (ref 13.0–17.7)
MCH: 31.7 pg (ref 26.6–33.0)
MCHC: 34.8 g/dL (ref 31.5–35.7)
MCV: 91 fL (ref 79–97)
Platelets: 243 10*3/uL (ref 150–450)
RBC: 4.61 x10E6/uL (ref 4.14–5.80)
RDW: 12.7 % (ref 11.6–15.4)
WBC: 9.4 10*3/uL (ref 3.4–10.8)

## 2020-05-30 LAB — TSH: TSH: 2.13 u[IU]/mL (ref 0.450–4.500)

## 2020-05-30 LAB — MAGNESIUM: Magnesium: 2 mg/dL (ref 1.6–2.3)

## 2020-05-30 LAB — HEPATIC FUNCTION PANEL
ALT: 18 IU/L (ref 0–44)
AST: 15 IU/L (ref 0–40)
Albumin: 4.4 g/dL (ref 3.7–4.7)
Alkaline Phosphatase: 69 IU/L (ref 48–121)
Bilirubin Total: 0.9 mg/dL (ref 0.0–1.2)
Bilirubin, Direct: 0.28 mg/dL (ref 0.00–0.40)
Total Protein: 6.6 g/dL (ref 6.0–8.5)

## 2020-05-30 NOTE — Telephone Encounter (Signed)
-----   Message from Berniece Salines, DO sent at 05/30/2020  8:36 AM EDT ----- Labs normal.

## 2020-05-30 NOTE — Telephone Encounter (Signed)
Spoke with patient regarding results and recommendation.  Patient verbalizes understanding and is agreeable to plan of care. Advised patient to call back with any issues or concerns.  

## 2020-06-08 ENCOUNTER — Ambulatory Visit: Payer: Medicare Other | Admitting: Cardiology

## 2020-06-08 ENCOUNTER — Other Ambulatory Visit: Payer: Self-pay

## 2020-06-08 ENCOUNTER — Encounter: Payer: Self-pay | Admitting: Cardiology

## 2020-06-08 VITALS — BP 113/73 | HR 67 | Ht 70.0 in | Wt 189.0 lb

## 2020-06-08 DIAGNOSIS — I2510 Atherosclerotic heart disease of native coronary artery without angina pectoris: Secondary | ICD-10-CM

## 2020-06-08 DIAGNOSIS — I493 Ventricular premature depolarization: Secondary | ICD-10-CM

## 2020-06-08 DIAGNOSIS — Z9861 Coronary angioplasty status: Secondary | ICD-10-CM

## 2020-06-08 DIAGNOSIS — I10 Essential (primary) hypertension: Secondary | ICD-10-CM

## 2020-06-08 MED ORDER — METOPROLOL TARTRATE 25 MG PO TABS: 12.5000 mg | ORAL_TABLET | Freq: Two times a day (BID) | ORAL | 3 refills | Status: DC
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
## 2020-06-08 NOTE — Patient Instructions (Addendum)
Medication Instructions:  Your physician has recommended you make the following change in your medication:  1.  REDUCE the Lopressor to 12.5 taking 1 tablet twice a day   *If you need a refill on your cardiac medications before your next appointment, please call your pharmacy*   Lab Work: None ordered  If you have labs (blood work) drawn today and your tests are completely normal, you will receive your results only by: Marland Kitchen MyChart Message (if you have MyChart) OR . A paper copy in the mail If you have any lab test that is abnormal or we need to change your treatment, we will call you to review the results.   Testing/Procedures: None ordered   Follow-Up: At [facility], you and your health needs are our priority.  As part of our continuing mission to provide you with exceptional heart care, we have created designated Provider Care Teams.  These Care Teams include your primary Cardiologist (physician) and Advanced Practice Providers (APPs -  Physician Assistants and Nurse Practitioners) who all work together to provide you with the care you need, when you need it.  We recommend signing up for the patient portal called "MyChart".  Sign up information is provided on this After Visit Summary.  MyChart is used to connect with patients for Virtual Visits (Telemedicine).  Patients are able to view lab/test results, encounter notes, upcoming appointments, etc.  Non-urgent messages can be sent to your provider as well.   To learn more about what you can do with MyChart, go to [email or web address].    Your next appointment:   Keep your 07/30/20 with Dr. Harriet Masson  The format for your next appointment:   In Person  Provider:   Berniece Salines, DO   Other Instructions

## 2020-06-19 ENCOUNTER — Telehealth (HOSPITAL_COMMUNITY): Payer: Self-pay

## 2020-06-19 ENCOUNTER — Encounter (HOSPITAL_COMMUNITY): Payer: Self-pay

## 2020-06-19 NOTE — Telephone Encounter (Signed)
Attempted to call patient in regards to Cardiac Rehab - LM on VM Mailed letter 

## 2020-06-22 ENCOUNTER — Telehealth (HOSPITAL_COMMUNITY): Payer: Self-pay | Admitting: Pharmacist

## 2020-06-25 NOTE — Telephone Encounter (Signed)
Cardiac Rehab Medication Review by a Pharmacist  Does the patient  feel that his/her medications are working for him/her?  yes  Has the patient been experiencing any side effects to the medications prescribed? Yes has been experiencing more vivid dreams since starting new medications And has been having some increased bruising on arms/hands.   Does the patient measure his/her own blood pressure or blood glucose at home?  Yes; HR about 50-53, checks BP 1-2 times per day-- never >130 or <100  BP usually runs 118/55-60  Does the patient have any problems obtaining medications due to transportation or finances?  No, hasn't had to go to the pharmacy yet since he obtained 90-day supply from the hospital  Understanding of regimen: good Understanding of indications: good Potential of compliance: good  Pharmacist Intervention: n/a  Wilson Singer, PharmD PGY1 Pharmacy Resident 06/25/2020 3:32 PM

## 2020-07-03 ENCOUNTER — Encounter (HOSPITAL_COMMUNITY)
Admission: RE | Admit: 2020-07-03 | Discharge: 2020-07-03 | Disposition: A | Discharge status: Home / Self Care | Payer: Medicare Other | Source: Ambulatory Visit | Attending: Cardiology | Admitting: Cardiology

## 2020-07-03 ENCOUNTER — Other Ambulatory Visit: Payer: Self-pay

## 2020-07-03 DIAGNOSIS — I2510 Atherosclerotic heart disease of native coronary artery without angina pectoris: Secondary | ICD-10-CM

## 2020-07-03 DIAGNOSIS — Z955 Presence of coronary angioplasty implant and graft: Secondary | ICD-10-CM | POA: Insufficient documentation

## 2020-07-03 DIAGNOSIS — Z9861 Coronary angioplasty status: Secondary | ICD-10-CM | POA: Insufficient documentation

## 2020-07-03 NOTE — Progress Notes (Signed)
Cardiac Rehab Telephone Note:  Successful telephone encounter to Vincent Mcbride to confirm Cardiac Rehab orientation appointment for 07/05/20 at 9:00 am. Nursing assessment completed. Patient questions answered. Instructions for appointment provided. Patient screening for Covid-19 negative.  Nefertari Rebman E. Rollene Rotunda RN, BSN [place]. [facility]  Cardiac and Pulmonary Rehabilitation Phone: [phone] Fax: [phone]

## 2020-07-05 ENCOUNTER — Encounter (HOSPITAL_COMMUNITY)
Admission: RE | Admit: 2020-07-05 | Discharge: 2020-07-05 | Disposition: A | Discharge status: Home / Self Care | Payer: Medicare Other | Source: Ambulatory Visit | Attending: Cardiology | Admitting: Cardiology

## 2020-07-05 ENCOUNTER — Encounter (HOSPITAL_COMMUNITY): Payer: Self-pay

## 2020-07-05 ENCOUNTER — Other Ambulatory Visit: Payer: Self-pay

## 2020-07-05 ENCOUNTER — Telehealth: Payer: Self-pay | Admitting: Cardiology

## 2020-07-05 VITALS — BP 120/60 | HR 49 | Ht 70.5 in | Wt 187.4 lb

## 2020-07-05 DIAGNOSIS — Z955 Presence of coronary angioplasty implant and graft: Secondary | ICD-10-CM

## 2020-07-05 NOTE — Telephone Encounter (Signed)
STAT if HR is under 50 or over 120 (normal HR is 60-100 beats per minute)  1) What is your heart rate? 49 this morning  2) Do you have a log of your heart rate readings (document readings)? 73 max, 49   3) Do you have any other symptoms? No symptoms   Verdis Frederickson, nurse at Cardiac Rehab, states the patient was seen by them this morning and the max HR of the patient today was 73. She states his BP was 120/60. She states when he left it was 112/62 HR 49 while resting.

## 2020-07-05 NOTE — Progress Notes (Signed)
Cardiac Individual Treatment Plan  Patient Details  Name: Vincent Mcbride MRN: [id number] Date of Birth: 08/22/1941 Referring Provider:     CARDIAC REHAB PHASE II ORIENTATION from 07/05/2020 in [place]  Referring Provider Berniece Salines, DO      Initial Encounter Date:    CARDIAC REHAB PHASE II ORIENTATION from 07/05/2020 in [place]  Date 07/05/20      Visit Diagnosis: 05/23/20 S/P DESx2 LAD, CFX  Patient's Home Medications on Admission:  Current Outpatient Medications:  .  acetaminophen (TYLENOL) 500 MG tablet, Take 500 mg by mouth every 6 (six) hours as needed (for severe headaches). , Disp: , Rfl:  .  amiodarone (PACERONE) 200 MG tablet, Take 1 tablet (200 mg total) by mouth daily., Disp: 90 tablet, Rfl: 1 .  aspirin EC 81 MG tablet, Take 81 mg by mouth every other day. , Disp: , Rfl:  .  atorvastatin (LIPITOR) 40 MG tablet, Take 1 tablet (40 mg total) by mouth daily., Disp: 90 tablet, Rfl: 2 .  Cholecalciferol (VITAMIN D3) 50 MCG (2000 UT) TABS, Take 2,000 Units by mouth daily. , Disp: , Rfl:  .  clopidogrel (PLAVIX) 75 MG tablet, Take 1 tablet (75 mg total) by mouth daily with breakfast., Disp: 90 tablet, Rfl: 3 .  Coenzyme Q10 100 MG capsule, Take 100 mg by mouth daily. , Disp: , Rfl:  .  Cyanocobalamin (VITAMIN B-12 PO), Take 1 tablet by mouth daily as needed (to boost energy).  (Patient not taking: Reported on 06/25/2020), Disp: , Rfl:  .  fexofenadine (ALLEGRA) 180 MG tablet, Take 180 mg by mouth daily. , Disp: , Rfl:  .  Glucosamine-Chondroitin (OSTEO BI-FLEX REGULAR STRENGTH PO), Take 1 tablet by mouth daily.  (Patient not taking: Reported on 06/08/2020), Disp: , Rfl:  .  Horse Chestnut 300 MG CAPS, Take 300 mg by mouth daily. , Disp: , Rfl:  .  metoprolol tartrate (LOPRESSOR) 25 MG tablet, Take 0.5 tablets (12.5 mg total) by mouth 2 (two) times daily., Disp: 90 tablet, Rfl: 3 .  Multiple Vitamins-Minerals  (EMERGEN-C VITAMIN C) PACK, Take 1 packet by mouth daily as needed (to boost the immune system). , Disp: , Rfl:  .  Multiple Vitamins-Minerals (MULTIVITAMIN PO), Take 1 tablet by mouth daily with breakfast. , Disp: , Rfl:  .  Multiple Vitamins-Minerals (PRESERVISION/LUTEIN PO), Take 1 capsule by mouth daily. , Disp: , Rfl:  .  nitroGLYCERIN (NITROSTAT) 0.4 MG SL tablet, Place 1 tablet (0.4 mg total) under the tongue every 5 (five) minutes x 3 doses as needed for chest pain., Disp: 25 tablet, Rfl: 0 .  Red Yeast Rice 600 MG CAPS, Take 600 mg by mouth daily.  (Patient not taking: Reported on 06/08/2020), Disp: , Rfl:  .  tadalafil (CIALIS) 20 MG tablet, Take 20 mg by mouth daily as needed for erectile dysfunction.  (Patient not taking: Reported on 06/25/2020), Disp: , Rfl:  .  tiZANidine (ZANAFLEX) 2 MG tablet, Take 2-4 mg by mouth See admin instructions. 1-3 times a day as needed for spasms (Patient not taking: Reported on 06/08/2020), Disp: , Rfl:  .  Wheat Dextrin (BENEFIBER) POWD, Take by mouth See admin instructions. Mix 1 teaspoonful into the morning cup of coffee and drink , Disp: , Rfl:  .  Zinc 100 MG TABS, Take 1 tablet by mouth daily. , Disp: , Rfl:   Past Medical History: Past Medical History:  Diagnosis Date  .  Abnormal nuclear stress test   . Benign prostatic hyperplasia with urinary obstruction 05/30/2014  . CAD S/P percutaneous coronary angioplasty 05/23/2020   To LAD and LCx 05/23/20  . Calculus of kidney 05/30/2014  . Chest pain 05/22/2020  . Chest pain of uncertain etiology 6/94/8546  . DDD (degenerative disc disease), cervical 05/30/2014  . Dysuria 05/30/2014  . Essential hypertension 05/29/2020  . HLD (hyperlipidemia) 05/23/2020  . Mixed hyperlipidemia 05/09/2020  . Neurogenic bladder 05/30/2014  . 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 Precordial pain 05/09/2020  . Prostatitis 05/30/2014  . Shortness of breath 05/09/2020    Tobacco  Use: Social History   Tobacco Use  Smoking Status Never Smoker  Smokeless Tobacco Never Used    Labs: Recent Review Scientist, physiological    Labs for ITP Cardiac and Pulmonary Rehab Latest Ref Rng & Units 05/23/2020   Cholestrol 0 - 200 mg/dL 118   LDLCALC 0 - 99 mg/dL 72   HDL >40 mg/dL 40(L)   Trlycerides <150 mg/dL 28      Capillary Blood Glucose: No results found for: GLUCAP   Exercise Target Goals: Exercise Program Goal: Individual exercise prescription set using results from initial 6 min walk test and THRR while considering  patient's activity barriers and safety.   Exercise Prescription Goal: Starting with aerobic activity 30 plus minutes a day, 3 days per week for initial exercise prescription. Provide home exercise prescription and guidelines that participant acknowledges understanding prior to discharge.  Activity Barriers & Risk Stratification:  Activity Barriers & Cardiac Risk Stratification - 07/05/20 1351      Activity Barriers & Cardiac Risk Stratification   Activity Barriers Neck/Spine Problems    Cardiac Risk Stratification Moderate           6 Minute Walk:  6 Minute Walk    Row Name 07/05/20 1350         6 Minute Walk   Phase Initial     Distance 1378 feet     Walk Time 6 minutes     # of Rest Breaks 0     MPH 2.6     METS 2.3     RPE 12     Perceived Dyspnea  0     VO2 Peak 8.2     Symptoms No     Resting HR 49 bpm     Resting BP 120/60     Resting Oxygen Saturation  98 %     Exercise Oxygen Saturation  during 6 min walk 99 %     Max Ex. HR 73 bpm     Max Ex. BP 120/60     2 Minute Post BP 112/62            Oxygen Initial Assessment:   Oxygen Re-Evaluation:   Oxygen Discharge (Final Oxygen Re-Evaluation):   Initial Exercise Prescription:  Initial Exercise Prescription - 07/05/20 1300      Date of Initial Exercise RX and Referring Provider   Date 07/05/20    Referring Provider Berniece Salines, DO    Expected Discharge Date  08/31/20      Treadmill   MPH 1.8    Grade 0    Minutes 15    METs 2.63      NuStep   Level 2    SPM 85    Minutes 15    METs 2.5      Prescription Details   Frequency (times per week)  3x    Duration Progress to 30 minutes of continuous aerobic without signs/symptoms of physical distress      Intensity   THRR 40-80% of Max Heartrate 56-113    Ratings of Perceived Exertion 11-13    Perceived Dyspnea 0-4      Progression   Progression Continue progressive overload as per policy without signs/symptoms or physical distress.      Resistance Training   Training Prescription Yes    Weight 3lbs    Reps 10-15           Perform Capillary Blood Glucose checks as needed.  Exercise Prescription Changes:   Exercise Comments:   Exercise Goals and Review:  Exercise Goals    Row Name 07/05/20 1351             Exercise Goals   Increase Physical Activity Yes       Intervention Provide advice, education, support and counseling about physical activity/exercise needs.;Develop an individualized exercise prescription for aerobic and resistive training based on initial evaluation findings, risk stratification, comorbidities and participant's personal goals.       Expected Outcomes Short Term: Attend rehab on a regular basis to increase amount of physical activity.;Long Term: Add in home exercise to make exercise part of routine and to increase amount of physical activity.;Long Term: Exercising regularly at least 3-5 days a week.       Increase Strength and Stamina Yes       Intervention Provide advice, education, support and counseling about physical activity/exercise needs.;Develop an individualized exercise prescription for aerobic and resistive training based on initial evaluation findings, risk stratification, comorbidities and participant's personal goals.       Expected Outcomes Short Term: Increase workloads from initial exercise prescription for resistance, speed, and  METs.;Short Term: Perform resistance training exercises routinely during rehab and add in resistance training at home;Long Term: Improve cardiorespiratory fitness, muscular endurance and strength as measured by increased METs and functional capacity (6MWT)       Able to understand and use rate of perceived exertion (RPE) scale Yes       Intervention Provide education and explanation on how to use RPE scale       Expected Outcomes Short Term: Able to use RPE daily in rehab to express subjective intensity level;Long Term:  Able to use RPE to guide intensity level when exercising independently       Knowledge and understanding of Target Heart Rate Range (THRR) Yes       Intervention Provide education and explanation of THRR including how the numbers were predicted and where they are located for reference       Expected Outcomes Short Term: Able to state/look up THRR;Short Term: Able to use daily as guideline for intensity in rehab;Long Term: Able to use THRR to govern intensity when exercising independently       Able to check pulse independently Yes       Intervention Review the importance of being able to check your own pulse for safety during independent exercise;Provide education and demonstration on how to check pulse in carotid and radial arteries.       Expected Outcomes Short Term: Able to explain why pulse checking is important during independent exercise;Long Term: Able to check pulse independently and accurately       Understanding of Exercise Prescription Yes       Intervention Provide education, explanation, and written materials on patient's individual exercise prescription       Expected  Outcomes Short Term: Able to explain program exercise prescription;Long Term: Able to explain home exercise prescription to exercise independently              Exercise Goals Re-Evaluation :    Discharge Exercise Prescription (Final Exercise Prescription Changes):   Nutrition:  Target Goals:  Understanding of nutrition guidelines, daily intake of sodium 1500mg , cholesterol 200mg , calories 30% from fat and 7% or less from saturated fats, daily to have 5 or more servings of fruits and vegetables.  Biometrics:  Pre Biometrics - 07/05/20 1351      Pre Biometrics   Height 5' 10.5" (1.791 m)    Weight 85 kg    Waist Circumference 41 inches    Hip Circumference 42 inches    Waist to Hip Ratio 0.98 %    BMI (Calculated) 26.5    Triceps Skinfold 10 mm    % Body Fat 26.1 %    Grip Strength 38 kg    Flexibility 0 in    Single Leg Stand 6.56 seconds            Nutrition Therapy Plan and Nutrition Goals:   Nutrition Assessments:   Nutrition Goals Re-Evaluation:   Nutrition Goals Discharge (Final Nutrition Goals Re-Evaluation):   Psychosocial: Target Goals: Acknowledge presence or absence of significant depression and/or stress, maximize coping skills, provide positive support system. Participant is able to verbalize types and ability to use techniques and skills needed for reducing stress and depression.  Initial Review & Psychosocial Screening:  Initial Psych Review & Screening - 07/05/20 1507      Initial Review   Current issues with None Identified      Family Dynamics   Good Support System? Yes   Zeyad lives at at home with his wife who supports him     Barriers   Psychosocial barriers to participate in program There are no identifiable barriers or psychosocial needs.      Screening Interventions   Interventions Encouraged to exercise           Quality of Life Scores:  Quality of Life - 07/05/20 1354      Quality of Life   Select Quality of Life      Quality of Life Scores   Health/Function Pre 22.9 %    Socioeconomic Pre 27.36 %    Psych/Spiritual Pre 26.86 %    Family Pre 26.4 %    GLOBAL Pre 25.15 %          Scores of 19 and below usually indicate a poorer quality of life in these areas.  A difference of  2-3 points is a clinically  meaningful difference.  A difference of 2-3 points in the total score of the Quality of Life Index has been associated with significant improvement in overall quality of life, self-image, physical symptoms, and general health in studies assessing change in quality of life.  PHQ-9: Recent Review Flowsheet Data   There is no flowsheet data to display.    Interpretation of Total Score  Total Score Depression Severity:  1-4 = Minimal depression, 5-9 = Mild depression, 10-14 = Moderate depression, 15-19 = Moderately severe depression, 20-27 = Severe depression   Psychosocial Evaluation and Intervention:   Psychosocial Re-Evaluation:   Psychosocial Discharge (Final Psychosocial Re-Evaluation):   Vocational Rehabilitation: Provide vocational rehab assistance to qualifying candidates.   Vocational Rehab Evaluation & Intervention:  Vocational Rehab - 07/05/20 1509      Initial Vocational Rehab Evaluation & Intervention  Assessment shows need for Vocational Rehabilitation No           Education: Education Goals: Education classes will be provided on a weekly basis, covering required topics. Participant will state understanding/return demonstration of topics presented.  Learning Barriers/Preferences:  Learning Barriers/Preferences - 07/05/20 1355      Learning Barriers/Preferences   Learning Barriers Sight    Learning Preferences Written Material;Skilled Demonstration;Individual Instruction           Education Topics: Hypertension, Hypertension Reduction -Define heart disease and high blood pressure. Discus how high blood pressure affects the body and ways to reduce high blood pressure.   Exercise and Your Heart -Discuss why it is important to exercise, the FITT principles of exercise, normal and abnormal responses to exercise, and how to exercise safely.   Angina -Discuss definition of angina, causes of angina, treatment of angina, and how to decrease risk of having  angina.   Cardiac Medications -Review what the following cardiac medications are used for, how they affect the body, and side effects that may occur when taking the medications.  Medications include Aspirin, Beta blockers, calcium channel blockers, ACE Inhibitors, angiotensin receptor blockers, diuretics, digoxin, and antihyperlipidemics.   Congestive Heart Failure -Discuss the definition of CHF, how to live with CHF, the signs and symptoms of CHF, and how keep track of weight and sodium intake.   Heart Disease and Intimacy -Discus the effect sexual activity has on the heart, how changes occur during intimacy as we age, and safety during sexual activity.   Smoking Cessation / COPD -Discuss different methods to quit smoking, the health benefits of quitting smoking, and the definition of COPD.   Nutrition I: Fats -Discuss the types of cholesterol, what cholesterol does to the heart, and how cholesterol levels can be controlled.   Nutrition II: Labels -Discuss the different components of food labels and how to read food label   Heart Parts/Heart Disease and PAD -Discuss the anatomy of the heart, the pathway of blood circulation through the heart, and these are affected by heart disease.   Stress I: Signs and Symptoms -Discuss the causes of stress, how stress may lead to anxiety and depression, and ways to limit stress.   Stress II: Relaxation -Discuss different types of relaxation techniques to limit stress.   Warning Signs of Stroke / TIA -Discuss definition of a stroke, what the signs and symptoms are of a stroke, and how to identify when someone is having stroke.   Knowledge Questionnaire Score:  Knowledge Questionnaire Score - 07/05/20 1354      Knowledge Questionnaire Score   Pre Score 23/24           Core Components/Risk Factors/Patient Goals at Admission:  Personal Goals and Risk Factors at Admission - 07/05/20 1510      Core Components/Risk Factors/Patient  Goals on Admission    Weight Management Yes;Weight Maintenance    Intervention Weight Management: Develop a combined nutrition and exercise program designed to reach desired caloric intake, while maintaining appropriate intake of nutrient and fiber, sodium and fats, and appropriate energy expenditure required for the weight goal.;Weight Management: Provide education and appropriate resources to help participant work on and attain dietary goals.    Expected Outcomes Short Term: Continue to assess and modify interventions until short term weight is achieved;Long Term: Adherence to nutrition and physical activity/exercise program aimed toward attainment of established weight goal;Weight Maintenance: Understanding of the daily nutrition guidelines, which includes 25-35% calories from fat, 7% or less cal  from saturated fats, less than 200mg  cholesterol, less than 1.5gm of sodium, & 5 or more servings of fruits and vegetables daily;Understanding recommendations for meals to include 15-35% energy as protein, 25-35% energy from fat, 35-60% energy from carbohydrates, less than 200mg  of dietary cholesterol, 20-35 gm of total fiber daily;Understanding of distribution of calorie intake throughout the day with the consumption of 4-5 meals/snacks    Hypertension Yes    Intervention Provide education on lifestyle modifcations including regular physical activity/exercise, weight management, moderate sodium restriction and increased consumption of fresh fruit, vegetables, and low fat dairy, alcohol moderation, and smoking cessation.;Monitor prescription use compliance.    Expected Outcomes Short Term: Continued assessment and intervention until BP is < 140/38mm HG in hypertensive participants. < 130/52mm HG in hypertensive participants with diabetes, heart failure or chronic kidney disease.;Long Term: Maintenance of blood pressure at goal levels.    Lipids Yes    Intervention Provide education and support for participant on  nutrition & aerobic/resistive exercise along with prescribed medications to achieve LDL 70mg , HDL >40mg .    Expected Outcomes Short Term: Participant states understanding of desired cholesterol values and is compliant with medications prescribed. Participant is following exercise prescription and nutrition guidelines.;Long Term: Cholesterol controlled with medications as prescribed, with individualized exercise RX and with personalized nutrition plan. Value goals: LDL < 70mg , HDL > 40 mg.           Core Components/Risk Factors/Patient Goals Review:    Core Components/Risk Factors/Patient Goals at Discharge (Final Review):    ITP Comments:  ITP Comments    Row Name 07/05/20 1330           ITP Comments Dr. Fransico Him, Medical Director              Comments: Hedy Camara attended orientation on 07/05/2020 to review rules and guidelines for program.  Completed 6 minute walk test, Intitial ITP, and exercise prescription.  VSS. Telemetry-Sinus Brady rate 49 resting non sustained  Entry blood pressure 120/60. Max heart rate 73. Post 6 minute walk test  Asymptomatic.  2 minute post walk test BP 112/62 lowest resting heart rate noted at 49.  Safety measures and social distancing in place per CDC guidelines. Will contact Dr Terrial Rhodes office regarding resting bradycardia. Patient left without complaints or symptoms. No PVC's were noted today during his exercise walk test.Will fax exercise flow sheets to Dr. Terrial Rhodes  office for review with today's ECG tracings.Barnet Pall, RN,BSN 07/05/2020 3:30 PM

## 2020-07-05 NOTE — Telephone Encounter (Signed)
I spoke with Verdis Frederickson at Cardiac Rehab.  Patient went for first session today and did 6 minute walk test.  He did not have any PVC's and only one PAC.  Heart rate ranged 49-73.  Patient reports heart rate has been in low 50's at home and occasionally will be 49.  He was having no symptoms at cardiac rehab but did report he sometimes feels lightheaded at home.  Verdis Frederickson will fax strips to [facility] office and is asking if medications need to be changed based on heart rate.  Patient is to start exercising on Monday.

## 2020-07-05 NOTE — Telephone Encounter (Signed)
° ° °  Vincent Mcbride calling back, she said she tried sending records to [facility] fax# [phone] but its not working. She will try to fax it to [employer] [phone]

## 2020-07-09 ENCOUNTER — Encounter (HOSPITAL_COMMUNITY)
Admission: RE | Admit: 2020-07-09 | Discharge: 2020-07-09 | Disposition: A | Discharge status: Home / Self Care | Payer: Medicare Other | Source: Ambulatory Visit | Attending: Cardiology | Admitting: Cardiology

## 2020-07-09 ENCOUNTER — Other Ambulatory Visit: Payer: Self-pay

## 2020-07-09 DIAGNOSIS — Z9861 Coronary angioplasty status: Secondary | ICD-10-CM | POA: Insufficient documentation

## 2020-07-09 DIAGNOSIS — I2510 Atherosclerotic heart disease of native coronary artery without angina pectoris: Secondary | ICD-10-CM | POA: Diagnosis present

## 2020-07-09 DIAGNOSIS — Z955 Presence of coronary angioplasty implant and graft: Secondary | ICD-10-CM | POA: Diagnosis present

## 2020-07-09 NOTE — Progress Notes (Signed)
Daily Session Note  Patient Details  Name: Vincent Mcbride MRN: [id number] Date of Birth: 1941-06-08 Referring Provider:     CARDIAC REHAB PHASE II ORIENTATION from 07/05/2020 in [place]  Referring Provider Berniece Salines, DO      Encounter Date: 07/09/2020  Check In:  Session Check In - 07/09/20 0657      Check-In   Supervising physician immediately available to respond to emergencies [facility] immediately available    Physician(s) Dr. Rodena Piety    Location [facility]    Staff Present Dorma Russell, MS,ACSM CEP, Exercise Physiologist;Olinty Celesta Aver, MS, ACSM CEP, Exercise Physiologist;Agamjot Kilgallon Rollene Rotunda, RN, Isaac Laud, MS, ACSM-CEP, Exercise Physiologist    Virtual Visit No    Medication changes reported     No    Fall or balance concerns reported    No    Tobacco Cessation No Change    Warm-up and Cool-down Performed on first and last piece of equipment    Resistance Training Performed Yes    VAD Patient? No    PAD/SET Patient? No      Pain Assessment   Currently in Pain? No/denies    Pain Score 0-No pain    Multiple Pain Sites No           Capillary Blood Glucose: No results found for this or any previous visit (from the past 24 hour(s)).    Social History   Tobacco Use  Smoking Status Never Smoker  Smokeless Tobacco Never Used    Goals Met:  Exercise tolerated well Personal goals reviewed No report of cardiac concerns or symptoms Strength training completed today  Goals Unmet:  Not Applicable  Comments: Pt started cardiac rehab today.  Pt tolerated light exercise without difficulty. VSS, telemetry-NSR, asymptomatic.  Medication list reconciled. Pt denies barriers to medicaiton compliance.  PSYCHOSOCIAL ASSESSMENT:  PHQ-0. Pt exhibits positive coping skills, hopeful outlook with supportive family. No psychosocial needs identified at this time, no psychosocial interventions necessary. Pt oriented  to exercise equipment and routine. Understanding verbalized.   Dr. Fransico Him is Medical Director for Cardiac Rehab at [facility].

## 2020-07-11 ENCOUNTER — Other Ambulatory Visit: Payer: Self-pay

## 2020-07-11 ENCOUNTER — Encounter (HOSPITAL_COMMUNITY)
Admission: RE | Admit: 2020-07-11 | Discharge: 2020-07-11 | Disposition: A | Discharge status: Home / Self Care | Payer: Medicare Other | Source: Ambulatory Visit | Attending: Cardiology | Admitting: Cardiology

## 2020-07-11 DIAGNOSIS — I2510 Atherosclerotic heart disease of native coronary artery without angina pectoris: Secondary | ICD-10-CM

## 2020-07-11 DIAGNOSIS — Z955 Presence of coronary angioplasty implant and graft: Secondary | ICD-10-CM

## 2020-07-13 ENCOUNTER — Encounter (HOSPITAL_COMMUNITY)
Admission: RE | Admit: 2020-07-13 | Discharge: 2020-07-13 | Disposition: A | Discharge status: Home / Self Care | Payer: Medicare Other | Source: Ambulatory Visit | Attending: Cardiology | Admitting: Cardiology

## 2020-07-13 ENCOUNTER — Other Ambulatory Visit: Payer: Self-pay

## 2020-07-13 DIAGNOSIS — I2510 Atherosclerotic heart disease of native coronary artery without angina pectoris: Secondary | ICD-10-CM

## 2020-07-13 DIAGNOSIS — Z955 Presence of coronary angioplasty implant and graft: Secondary | ICD-10-CM

## 2020-07-13 DIAGNOSIS — Z9861 Coronary angioplasty status: Secondary | ICD-10-CM

## 2020-07-13 NOTE — Progress Notes (Signed)
Vincent Mcbride 79 y.o. male Nutrition Note  Visit Diagnosis: 05/23/20 S/P DESx2 LAD, CFX  Past Medical History:  Diagnosis Date  . Abnormal nuclear stress test   . Benign prostatic hyperplasia with urinary obstruction 05/30/2014  . CAD S/P percutaneous coronary angioplasty 05/23/2020   To LAD and LCx 05/23/20  . Calculus of kidney 05/30/2014  . Chest pain 05/22/2020  . Chest pain of uncertain etiology 4/48/1856  . DDD (degenerative disc disease), cervical 05/30/2014  . Dysuria 05/30/2014  . Essential hypertension 05/29/2020  . HLD (hyperlipidemia) 05/23/2020  . Mixed hyperlipidemia 05/09/2020  . Neurogenic bladder 05/30/2014  . 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 Precordial pain 05/09/2020  . Prostatitis 05/30/2014  . Shortness of breath 05/09/2020     Medications reviewed.   Current Outpatient Medications:  .  acetaminophen (TYLENOL) 500 MG tablet, Take 500 mg by mouth every 6 (six) hours as needed (for severe headaches). , Disp: , Rfl:  .  amiodarone (PACERONE) 200 MG tablet, Take 1 tablet (200 mg total) by mouth daily., Disp: 90 tablet, Rfl: 1 .  aspirin EC 81 MG tablet, Take 81 mg by mouth every other day. , Disp: , Rfl:  .  atorvastatin (LIPITOR) 40 MG tablet, Take 1 tablet (40 mg total) by mouth daily., Disp: 90 tablet, Rfl: 2 .  Cholecalciferol (VITAMIN D3) 50 MCG (2000 UT) TABS, Take 2,000 Units by mouth daily. , Disp: , Rfl:  .  clopidogrel (PLAVIX) 75 MG tablet, Take 1 tablet (75 mg total) by mouth daily with breakfast., Disp: 90 tablet, Rfl: 3 .  Coenzyme Q10 100 MG capsule, Take 100 mg by mouth daily. , Disp: , Rfl:  .  Cyanocobalamin (VITAMIN B-12 PO), Take 1 tablet by mouth daily as needed (to boost energy).  (Patient not taking: Reported on 06/25/2020), Disp: , Rfl:  .  fexofenadine (ALLEGRA) 180 MG tablet, Take 180 mg by mouth daily. , Disp: , Rfl:  .  Glucosamine-Chondroitin (OSTEO BI-FLEX REGULAR STRENGTH PO), Take 1 tablet by  mouth daily.  (Patient not taking: Reported on 06/08/2020), Disp: , Rfl:  .  Horse Chestnut 300 MG CAPS, Take 300 mg by mouth daily. , Disp: , Rfl:  .  metoprolol tartrate (LOPRESSOR) 25 MG tablet, Take 0.5 tablets (12.5 mg total) by mouth 2 (two) times daily., Disp: 90 tablet, Rfl: 3 .  Multiple Vitamins-Minerals (EMERGEN-C VITAMIN C) PACK, Take 1 packet by mouth daily as needed (to boost the immune system). , Disp: , Rfl:  .  Multiple Vitamins-Minerals (MULTIVITAMIN PO), Take 1 tablet by mouth daily with breakfast. , Disp: , Rfl:  .  Multiple Vitamins-Minerals (PRESERVISION/LUTEIN PO), Take 1 capsule by mouth daily. , Disp: , Rfl:  .  nitroGLYCERIN (NITROSTAT) 0.4 MG SL tablet, Place 1 tablet (0.4 mg total) under the tongue every 5 (five) minutes x 3 doses as needed for chest pain., Disp: 25 tablet, Rfl: 0 .  Red Yeast Rice 600 MG CAPS, Take 600 mg by mouth daily.  (Patient not taking: Reported on 06/08/2020), Disp: , Rfl:  .  tadalafil (CIALIS) 20 MG tablet, Take 20 mg by mouth daily as needed for erectile dysfunction.  (Patient not taking: Reported on 06/25/2020), Disp: , Rfl:  .  tiZANidine (ZANAFLEX) 2 MG tablet, Take 2-4 mg by mouth See admin instructions. 1-3 times a day as needed for spasms (Patient not taking: Reported on 06/08/2020), Disp: , Rfl:  .  Wheat Dextrin (BENEFIBER) POWD, Take  by mouth See admin instructions. Mix 1 teaspoonful into the morning cup of coffee and drink , Disp: , Rfl:  .  Zinc 100 MG TABS, Take 1 tablet by mouth daily. , Disp: , Rfl:    Ht Readings from Last 1 Encounters:  07/05/20 5' 10.5" (1.791 m)     Wt Readings from Last 3 Encounters:  07/05/20 187 lb 6.3 oz (85 kg)  06/08/20 189 lb (85.7 kg)  05/29/20 190 lb 3.2 oz (86.3 kg)     There is no height or weight on file to calculate BMI.   Social History   Tobacco Use  Smoking Status Never Smoker  Smokeless Tobacco Never Used     Lab Results  Component Value Date   CHOL 118 05/23/2020   Lab Results   Component Value Date   HDL 40 (L) 05/23/2020   Lab Results  Component Value Date   LDLCALC 72 05/23/2020   Lab Results  Component Value Date   TRIG 28 05/23/2020     No results found for: HGBA1C   CBG (last 3)  No results for input(s): GLUCAP in the last 72 hours.   Nutrition Note  Spoke with pt. Nutrition Plan and Nutrition Survey goals reviewed with pt. Pt is following a Heart Healthy diet.    Per discussion, pt does use canned/convenience foods often. Pt does not add salt to food. Pt does not eat out frequently. He and his wife cook at home. They try to incorporate vegetables to evening meals. We discussed adding more fatty fish.  Avoids fried food. Incorporates nuts. He does read labels. He looks at carbs and sodium. We reviewed label reading. Reinforced with handout.  He says he would like to lose 7 lbs.   Pt expressed understanding of the information reviewed.   Nutrition Diagnosis ? Food-and nutrition-related knowledge deficit related to lack of exposure to information as related to diagnosis of: ? CVD ?  Nutrition Intervention ? Pt's individual nutrition plan reviewed with pt. ? Benefits of adopting Heart Healthy diet discussed when Medficts reviewed.   ? Continue client-centered nutrition education by RD, as part of interdisciplinary care.  Goal(s) ? Pt to build a healthy plate including vegetables, fruits, whole grains, and low-fat dairy products in a heart healthy meal plan. Plan:   Will provide client-centered nutrition education as part of interdisciplinary care  Monitor and evaluate progress toward nutrition goal with team.   Michaele Offer, MS, RDN, LDN

## 2020-07-16 ENCOUNTER — Encounter (HOSPITAL_COMMUNITY)
Admission: RE | Admit: 2020-07-16 | Discharge: 2020-07-16 | Disposition: A | Discharge status: Home / Self Care | Payer: Medicare Other | Source: Ambulatory Visit | Attending: Cardiology | Admitting: Cardiology

## 2020-07-16 ENCOUNTER — Other Ambulatory Visit: Payer: Self-pay

## 2020-07-16 DIAGNOSIS — Z955 Presence of coronary angioplasty implant and graft: Secondary | ICD-10-CM | POA: Diagnosis not present

## 2020-07-16 DIAGNOSIS — I2510 Atherosclerotic heart disease of native coronary artery without angina pectoris: Secondary | ICD-10-CM

## 2020-07-18 ENCOUNTER — Telehealth: Payer: Self-pay | Admitting: Cardiology

## 2020-07-18 ENCOUNTER — Encounter (HOSPITAL_COMMUNITY)
Admission: RE | Admit: 2020-07-18 | Discharge: 2020-07-18 | Disposition: A | Discharge status: Home / Self Care | Payer: Medicare Other | Source: Ambulatory Visit | Attending: Cardiology | Admitting: Cardiology

## 2020-07-18 ENCOUNTER — Other Ambulatory Visit: Payer: Self-pay

## 2020-07-18 DIAGNOSIS — I2510 Atherosclerotic heart disease of native coronary artery without angina pectoris: Secondary | ICD-10-CM

## 2020-07-18 DIAGNOSIS — Z955 Presence of coronary angioplasty implant and graft: Secondary | ICD-10-CM | POA: Diagnosis not present

## 2020-07-18 NOTE — Telephone Encounter (Signed)
Pt c/o medication issue:  1. Name of Medication: amiodarone (PACERONE) 200 MG tablet  2. How are you currently taking this medication (dosage and times per day)? 1 tablet daily  3. Are you having a reaction (difficulty breathing--STAT)? no  4. What is your medication issue? Patient states while on the medication he has been having nervousness, a shaky right hand, and has a hard time sleeping.

## 2020-07-18 NOTE — Progress Notes (Addendum)
Reviewed home exercise guidelines with patient including endpoints, temperature precautions, target heart rate and rate of perceived exertion. Patient is currently walking 25 minutes most days of the week weather permitting  as his mode of home exercise. We discussed increasing duration from 25 to 30 minutes, and patient is amenable to this. Patient has a monitor to check his pulse. Patient is looking into buying 3 or 4 lb weights to do his resistance training at home. Patient voices understanding of instructions given.  Sol Passer, MS, ACSM CEP

## 2020-07-19 NOTE — Telephone Encounter (Signed)
Please have him stop the amiodarone until I see him.

## 2020-07-19 NOTE — Telephone Encounter (Signed)
Spoke to patient just now and let him know Dr. Terrial Rhodes recommendations. He verbalizes understanding and thanks me for the call back.    Encouraged patient to call back with any questions or concerns.

## 2020-07-20 ENCOUNTER — Other Ambulatory Visit: Payer: Self-pay

## 2020-07-20 ENCOUNTER — Encounter (HOSPITAL_COMMUNITY)
Admission: RE | Admit: 2020-07-20 | Discharge: 2020-07-20 | Disposition: A | Discharge status: Home / Self Care | Payer: Medicare Other | Source: Ambulatory Visit | Attending: Cardiology | Admitting: Cardiology

## 2020-07-20 DIAGNOSIS — Z955 Presence of coronary angioplasty implant and graft: Secondary | ICD-10-CM | POA: Diagnosis not present

## 2020-07-20 DIAGNOSIS — I2510 Atherosclerotic heart disease of native coronary artery without angina pectoris: Secondary | ICD-10-CM

## 2020-07-23 ENCOUNTER — Other Ambulatory Visit: Payer: Self-pay

## 2020-07-23 ENCOUNTER — Encounter (HOSPITAL_COMMUNITY)
Admission: RE | Admit: 2020-07-23 | Discharge: 2020-07-23 | Disposition: A | Discharge status: Home / Self Care | Payer: Medicare Other | Source: Ambulatory Visit | Attending: Cardiology | Admitting: Cardiology

## 2020-07-23 DIAGNOSIS — Z9861 Coronary angioplasty status: Secondary | ICD-10-CM

## 2020-07-23 DIAGNOSIS — I2510 Atherosclerotic heart disease of native coronary artery without angina pectoris: Secondary | ICD-10-CM

## 2020-07-23 DIAGNOSIS — Z955 Presence of coronary angioplasty implant and graft: Secondary | ICD-10-CM | POA: Diagnosis not present

## 2020-07-25 ENCOUNTER — Encounter (HOSPITAL_COMMUNITY)
Admission: RE | Admit: 2020-07-25 | Discharge: 2020-07-25 | Disposition: A | Discharge status: Home / Self Care | Payer: Medicare Other | Source: Ambulatory Visit | Attending: Cardiology | Admitting: Cardiology

## 2020-07-25 ENCOUNTER — Other Ambulatory Visit: Payer: Self-pay

## 2020-07-25 DIAGNOSIS — I2510 Atherosclerotic heart disease of native coronary artery without angina pectoris: Secondary | ICD-10-CM

## 2020-07-25 DIAGNOSIS — Z955 Presence of coronary angioplasty implant and graft: Secondary | ICD-10-CM

## 2020-07-26 NOTE — Progress Notes (Addendum)
Cardiac Individual Treatment Plan  Patient Details  Name: Vincent Mcbride MRN: [id number] Date of Birth: 02-27-1941 Referring Provider:     CARDIAC REHAB PHASE II ORIENTATION from 07/05/2020 in [place]  Referring Provider Berniece Salines, DO      Initial Encounter Date:    CARDIAC REHAB PHASE II ORIENTATION from 07/05/2020 in [place]  Date 07/05/20      Visit Diagnosis: CAD S/P percutaneous coronary angioplasty  05/23/20 S/P DESx2 LAD, CFX  Patient's Home Medications on Admission:  Current Outpatient Medications:  .  acetaminophen (TYLENOL) 500 MG tablet, Take 500 mg by mouth every 6 (six) hours as needed (for severe headaches). , Disp: , Rfl:  .  amiodarone (PACERONE) 200 MG tablet, Take 1 tablet (200 mg total) by mouth daily., Disp: 90 tablet, Rfl: 1 .  aspirin EC 81 MG tablet, Take 81 mg by mouth every other day. , Disp: , Rfl:  .  atorvastatin (LIPITOR) 40 MG tablet, Take 1 tablet (40 mg total) by mouth daily., Disp: 90 tablet, Rfl: 2 .  Cholecalciferol (VITAMIN D3) 50 MCG (2000 UT) TABS, Take 2,000 Units by mouth daily. , Disp: , Rfl:  .  clopidogrel (PLAVIX) 75 MG tablet, Take 1 tablet (75 mg total) by mouth daily with breakfast., Disp: 90 tablet, Rfl: 3 .  Coenzyme Q10 100 MG capsule, Take 100 mg by mouth daily. , Disp: , Rfl:  .  Cyanocobalamin (VITAMIN B-12 PO), Take 1 tablet by mouth daily as needed (to boost energy).  (Patient not taking: Reported on 06/25/2020), Disp: , Rfl:  .  fexofenadine (ALLEGRA) 180 MG tablet, Take 180 mg by mouth daily. , Disp: , Rfl:  .  Glucosamine-Chondroitin (OSTEO BI-FLEX REGULAR STRENGTH PO), Take 1 tablet by mouth daily.  (Patient not taking: Reported on 06/08/2020), Disp: , Rfl:  .  Horse Chestnut 300 MG CAPS, Take 300 mg by mouth daily. , Disp: , Rfl:  .  metoprolol tartrate (LOPRESSOR) 25 MG tablet, Take 0.5 tablets (12.5 mg total) by mouth 2 (two) times daily., Disp: 90  tablet, Rfl: 3 .  Multiple Vitamins-Minerals (EMERGEN-C VITAMIN C) PACK, Take 1 packet by mouth daily as needed (to boost the immune system). , Disp: , Rfl:  .  Multiple Vitamins-Minerals (MULTIVITAMIN PO), Take 1 tablet by mouth daily with breakfast. , Disp: , Rfl:  .  Multiple Vitamins-Minerals (PRESERVISION/LUTEIN PO), Take 1 capsule by mouth daily. , Disp: , Rfl:  .  nitroGLYCERIN (NITROSTAT) 0.4 MG SL tablet, Place 1 tablet (0.4 mg total) under the tongue every 5 (five) minutes x 3 doses as needed for chest pain., Disp: 25 tablet, Rfl: 0 .  Red Yeast Rice 600 MG CAPS, Take 600 mg by mouth daily.  (Patient not taking: Reported on 06/08/2020), Disp: , Rfl:  .  tadalafil (CIALIS) 20 MG tablet, Take 20 mg by mouth daily as needed for erectile dysfunction.  (Patient not taking: Reported on 06/25/2020), Disp: , Rfl:  .  tiZANidine (ZANAFLEX) 2 MG tablet, Take 2-4 mg by mouth See admin instructions. 1-3 times a day as needed for spasms (Patient not taking: Reported on 06/08/2020), Disp: , Rfl:  .  Wheat Dextrin (BENEFIBER) POWD, Take by mouth See admin instructions. Mix 1 teaspoonful into the morning cup of coffee and drink , Disp: , Rfl:  .  Zinc 100 MG TABS, Take 1 tablet by mouth daily. , Disp: , Rfl:   Past Medical History: Past Medical  History:  Diagnosis Date  . Abnormal nuclear stress test   . Benign prostatic hyperplasia with urinary obstruction 05/30/2014  . CAD S/P percutaneous coronary angioplasty 05/23/2020   To LAD and LCx 05/23/20  . Calculus of kidney 05/30/2014  . Chest pain 05/22/2020  . Chest pain of uncertain etiology 3/41/9379  . DDD (degenerative disc disease), cervical 05/30/2014  . Dysuria 05/30/2014  . Essential hypertension 05/29/2020  . HLD (hyperlipidemia) 05/23/2020  . Mixed hyperlipidemia 05/09/2020  . Neurogenic bladder 05/30/2014  . 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 Precordial pain 05/09/2020  . Prostatitis 05/30/2014  .  Shortness of breath 05/09/2020    Tobacco Use: Social History   Tobacco Use  Smoking Status Never Smoker  Smokeless Tobacco Never Used    Labs: Recent Review Scientist, physiological    Labs for ITP Cardiac and Pulmonary Rehab Latest Ref Rng & Units 05/23/2020   Cholestrol 0 - 200 mg/dL 118   LDLCALC 0 - 99 mg/dL 72   HDL >40 mg/dL 40(L)   Trlycerides <150 mg/dL 28      Capillary Blood Glucose: No results found for: GLUCAP   Exercise Target Goals: Exercise Program Goal: Individual exercise prescription set using results from initial 6 min walk test and THRR while considering  patient's activity barriers and safety.   Exercise Prescription Goal: Initial exercise prescription builds to 30-45 minutes a day of aerobic activity, 2-3 days per week.  Home exercise guidelines will be given to patient during program as part of exercise prescription that the participant will acknowledge.  Activity Barriers & Risk Stratification:  Activity Barriers & Cardiac Risk Stratification - 07/05/20 1351      Activity Barriers & Cardiac Risk Stratification   Activity Barriers Neck/Spine Problems    Cardiac Risk Stratification Moderate           6 Minute Walk:  6 Minute Walk    Row Name 07/05/20 1350         6 Minute Walk   Phase Initial     Distance 1378 feet     Walk Time 6 minutes     # of Rest Breaks 0     MPH 2.6     METS 2.3     RPE 12     Perceived Dyspnea  0     VO2 Peak 8.2     Symptoms No     Resting HR 49 bpm     Resting BP 120/60     Resting Oxygen Saturation  98 %     Exercise Oxygen Saturation  during 6 min walk 99 %     Max Ex. HR 73 bpm     Max Ex. BP 120/60     2 Minute Post BP 112/62            Oxygen Initial Assessment:   Oxygen Re-Evaluation:   Oxygen Discharge (Final Oxygen Re-Evaluation):   Initial Exercise Prescription:  Initial Exercise Prescription - 07/05/20 1300      Date of Initial Exercise RX and Referring Provider   Date 07/05/20     Referring Provider Berniece Salines, DO    Expected Discharge Date 08/31/20      Treadmill   MPH 1.8    Grade 0    Minutes 15    METs 2.63      NuStep   Level 2    SPM 85    Minutes 15    METs 2.5  Prescription Details   Frequency (times per week) 3x    Duration Progress to 30 minutes of continuous aerobic without signs/symptoms of physical distress      Intensity   THRR 40-80% of Max Heartrate 56-113    Ratings of Perceived Exertion 11-13    Perceived Dyspnea 0-4      Progression   Progression Continue progressive overload as per policy without signs/symptoms or physical distress.      Resistance Training   Training Prescription Yes    Weight 3lbs    Reps 10-15           Perform Capillary Blood Glucose checks as needed.  Exercise Prescription Changes:  Exercise Prescription Changes    Row Name 07/09/20 0702 07/16/20 0701           Response to Exercise   Blood Pressure (Admit) 122/48 108/58      Blood Pressure (Exercise) 138/68 110/70      Blood Pressure (Exit) 106/50 100/60      Heart Rate (Admit) 68 bpm 50 bpm      Heart Rate (Exercise) 88 bpm 92 bpm      Heart Rate (Exit) 61 bpm 59 bpm      Rating of Perceived Exertion (Exercise) 13 11      Symptoms none none      Comments Off to a good start with exercise. --      Duration Continue with 30 min of aerobic exercise without signs/symptoms of physical distress. Continue with 30 min of aerobic exercise without signs/symptoms of physical distress.      Intensity THRR unchanged THRR unchanged        Progression   Progression Continue to progress workloads to maintain intensity without signs/symptoms of physical distress. Continue to progress workloads to maintain intensity without signs/symptoms of physical distress.      Average METs 2.3 2.6        Resistance Training   Training Prescription Yes Yes      Weight 3lbs 3lbs      Reps 10-15 10-15      Time 10 Minutes 10 Minutes        Interval Training    Interval Training No No        Treadmill   MPH 1.8 1.8      Grade 0 0      Minutes 15 15      METs 2.38 2.38        NuStep   Level 2 3      SPM 85 85      Minutes 15 15      METs 2.3 2.8             Exercise Comments:  Exercise Comments    Row Name 07/09/20 0802 07/18/20 8299         Exercise Comments Patient tolerated first session of exercise well without symptoms. Reviewed home exercise guidelines, METs, and goals with patient.             Exercise Goals and Review:  Exercise Goals    Row Name 07/05/20 1351             Exercise Goals   Increase Physical Activity Yes       Intervention Provide advice, education, support and counseling about physical activity/exercise needs.;Develop an individualized exercise prescription for aerobic and resistive training based on initial evaluation findings, risk stratification, comorbidities and participant's personal goals.       Expected Outcomes  Short Term: Attend rehab on a regular basis to increase amount of physical activity.;Long Term: Add in home exercise to make exercise part of routine and to increase amount of physical activity.;Long Term: Exercising regularly at least 3-5 days a week.       Increase Strength and Stamina Yes       Intervention Provide advice, education, support and counseling about physical activity/exercise needs.;Develop an individualized exercise prescription for aerobic and resistive training based on initial evaluation findings, risk stratification, comorbidities and participant's personal goals.       Expected Outcomes Short Term: Increase workloads from initial exercise prescription for resistance, speed, and METs.;Short Term: Perform resistance training exercises routinely during rehab and add in resistance training at home;Long Term: Improve cardiorespiratory fitness, muscular endurance and strength as measured by increased METs and functional capacity (6MWT)       Able to understand and use rate  of perceived exertion (RPE) scale Yes       Intervention Provide education and explanation on how to use RPE scale       Expected Outcomes Short Term: Able to use RPE daily in rehab to express subjective intensity level;Long Term:  Able to use RPE to guide intensity level when exercising independently       Knowledge and understanding of Target Heart Rate Range (THRR) Yes       Intervention Provide education and explanation of THRR including how the numbers were predicted and where they are located for reference       Expected Outcomes Short Term: Able to state/look up THRR;Short Term: Able to use daily as guideline for intensity in rehab;Long Term: Able to use THRR to govern intensity when exercising independently       Able to check pulse independently Yes       Intervention Review the importance of being able to check your own pulse for safety during independent exercise;Provide education and demonstration on how to check pulse in carotid and radial arteries.       Expected Outcomes Short Term: Able to explain why pulse checking is important during independent exercise;Long Term: Able to check pulse independently and accurately       Understanding of Exercise Prescription Yes       Intervention Provide education, explanation, and written materials on patient's individual exercise prescription       Expected Outcomes Short Term: Able to explain program exercise prescription;Long Term: Able to explain home exercise prescription to exercise independently              Exercise Goals Re-Evaluation :  Exercise Goals Re-Evaluation    Row Name 07/09/20 0802 07/18/20 0721           Exercise Goal Re-Evaluation   Exercise Goals Review Increase Physical Activity;Able to understand and use rate of perceived exertion (RPE) scale Increase Physical Activity;Able to understand and use rate of perceived exertion (RPE) scale;Increase Strength and Stamina;Knowledge and understanding of Target Heart Rate  Range (THRR);Able to check pulse independently;Understanding of Exercise Prescription      Comments Patient able to understand and use RPE scale appropriately. Reviewed home exercise guidelines with patient including endpoints, temperature precautions, target heart rate and rate of perceived exertion. Patient is currently walking 25 minutes most days of the week weather permitting  as his mode of home exercise. We discussed increasing duration from 25 to 30 minutes, and patient is amenable to this. Patient has a monitor to check his pulse. Patient is looking into buying 3  or 4 lb weights to do his resistance training at home.      Expected Outcomes [place] workloads as tolerated to help improve cardiorespiratory fitness. Patient will increase walking at home from 25 minutes to 30 minutes.             Discharge Exercise Prescription (Final Exercise Prescription Changes):  Exercise Prescription Changes - 07/16/20 0701      Response to Exercise   Blood Pressure (Admit) 108/58    Blood Pressure (Exercise) 110/70    Blood Pressure (Exit) 100/60    Heart Rate (Admit) 50 bpm    Heart Rate (Exercise) 92 bpm    Heart Rate (Exit) 59 bpm    Rating of Perceived Exertion (Exercise) 11    Symptoms none    Duration Continue with 30 min of aerobic exercise without signs/symptoms of physical distress.    Intensity THRR unchanged      Progression   Progression Continue to progress workloads to maintain intensity without signs/symptoms of physical distress.    Average METs 2.6      Resistance Training   Training Prescription Yes    Weight 3lbs    Reps 10-15    Time 10 Minutes      Interval Training   Interval Training No      Treadmill   MPH 1.8    Grade 0    Minutes 15    METs 2.38      NuStep   Level 3    SPM 85    Minutes 15    METs 2.8           Nutrition:  Target Goals: Understanding of nutrition guidelines, daily intake of sodium 1500mg , cholesterol 200mg , calories 30% from  fat and 7% or less from saturated fats, daily to have 5 or more servings of fruits and vegetables.  Biometrics:  Pre Biometrics - 07/05/20 1351      Pre Biometrics   Height 5' 10.5" (1.791 m)    Weight 85 kg    Waist Circumference 41 inches    Hip Circumference 42 inches    Waist to Hip Ratio 0.98 %    BMI (Calculated) 26.5    Triceps Skinfold 10 mm    % Body Fat 26.1 %    Grip Strength 38 kg    Flexibility 0 in    Single Leg Stand 6.56 seconds            Nutrition Therapy Plan and Nutrition Goals:  Nutrition Therapy & Goals - 07/13/20 0948      Nutrition Therapy   Diet Heart Healthy    Drug/Food Interactions Statins/Certain Fruits      Personal Nutrition Goals   Nutrition Goal Pt to build a healthy plate including vegetables, fruits, whole grains, and low-fat dairy products in a heart healthy meal plan.      Intervention Plan   Intervention Prescribe, educate and counsel regarding individualized specific dietary modifications aiming towards targeted core components such as weight, hypertension, lipid management, diabetes, heart failure and other comorbidities.;Nutrition handout(s) given to patient.    Expected Outcomes Short Term Goal: Understand basic principles of dietary content, such as calories, fat, sodium, cholesterol and nutrients.           Nutrition Assessments:   Nutrition Goals Re-Evaluation:  Nutrition Goals Re-Evaluation    [place] Name 07/13/20 0948 07/24/20 1430           Goals   Current Weight 187 lb (84.8 kg) 187  lb 2.7 oz (84.9 kg)      Nutrition Goal -- Pt to build a healthy plate including vegetables, fruits, whole grains, and low-fat dairy products in a heart healthy meal plan.             Nutrition Goals Re-Evaluation:  Nutrition Goals Re-Evaluation    [place] Name 07/13/20 0948 07/24/20 1430           Goals   Current Weight 187 lb (84.8 kg) 187 lb 2.7 oz (84.9 kg)      Nutrition Goal -- Pt to build a healthy plate including  vegetables, fruits, whole grains, and low-fat dairy products in a heart healthy meal plan.             Nutrition Goals Discharge (Final Nutrition Goals Re-Evaluation):  Nutrition Goals Re-Evaluation - 07/24/20 1430      Goals   Current Weight 187 lb 2.7 oz (84.9 kg)    Nutrition Goal Pt to build a healthy plate including vegetables, fruits, whole grains, and low-fat dairy products in a heart healthy meal plan.           Psychosocial: Target Goals: Acknowledge presence or absence of significant depression and/or stress, maximize coping skills, provide positive support system. Participant is able to verbalize types and ability to use techniques and skills needed for reducing stress and depression.  Initial Review & Psychosocial Screening:  Initial Psych Review & Screening - 07/05/20 1507      Initial Review   Current issues with None Identified      Family Dynamics   Good Support System? Yes   Vincent Mcbride lives at at home with his wife who supports him     Barriers   Psychosocial barriers to participate in program There are no identifiable barriers or psychosocial needs.      Screening Interventions   Interventions Encouraged to exercise           Quality of Life Scores:  Quality of Life - 07/05/20 1354      Quality of Life   Select Quality of Life      Quality of Life Scores   Health/Function Pre 22.9 %    Socioeconomic Pre 27.36 %    Psych/Spiritual Pre 26.86 %    Family Pre 26.4 %    GLOBAL Pre 25.15 %          Scores of 19 and below usually indicate a poorer quality of life in these areas.  A difference of  2-3 points is a clinically meaningful difference.  A difference of 2-3 points in the total score of the Quality of Life Index has been associated with significant improvement in overall quality of life, self-image, physical symptoms, and general health in studies assessing change in quality of life.  PHQ-9: Recent Review Flowsheet Data   There is no flowsheet  data to display.    Interpretation of Total Score  Total Score Depression Severity:  1-4 = Minimal depression, 5-9 = Mild depression, 10-14 = Moderate depression, 15-19 = Moderately severe depression, 20-27 = Severe depression   Psychosocial Evaluation and Intervention:  Psychosocial Evaluation - 07/09/20 1007      Psychosocial Evaluation & Interventions   Interventions Encouraged to exercise with the program and follow exercise prescription    Comments Vincent Mcbride presents to his first cardiac rehab exercise session with a positive attitude and outlook. He admits to having a strong support system at home. He enjoys playing with his son's dog and doing  volunteer work with his church. Vincent Mcbride denies psychosocial barriers to participate in CR or self health management. No interventions needed at this time.    Expected Outcomes Vincent Mcbride will continue to have a positive attitude and outlook. He will utilize his support system as needed. He will continue to participate in volunteer activities and activities that bring him joy and utilize these activities to manage any stressors that may arise. He will continue to self assesss for psychosocial barriers and notify staff if barriers occur           Psychosocial Re-Evaluation:  Psychosocial Re-Evaluation    [place] Name 07/09/20 1012             Psychosocial Re-Evaluation   Current issues with None Identified       Comments Vincent Mcbride presents to his first cardiac rehab exercise session with a positive attitude and outlook. He admits to having a strong support system at home. He enjoys playing with his son's dog and doing volunteer work with his church. Vincent Mcbride denies psychosocial barriers to participate in CR or self health management. No interventions needed at this time.       Expected Outcomes Vincent Mcbride will continue to have a positive attitude and outlook. He will utilize his support system as needed. He will continue to  participate in volunteer activities and activities that bring him joy and utilize these activities to manage any stressors that may arise. He will continue to self assesss for psychosocial barriers and notify staff if barriers occur       Interventions Encouraged to attend Cardiac Rehabilitation for the exercise       Continue Psychosocial Services  No Follow up required              Psychosocial Discharge (Final Psychosocial Re-Evaluation):  Psychosocial Re-Evaluation - 07/09/20 1012      Psychosocial Re-Evaluation   Current issues with None Identified    Comments Vincent Mcbride presents to his first cardiac rehab exercise session with a positive attitude and outlook. He admits to having a strong support system at home. He enjoys playing with his son's dog and doing volunteer work with his church. Vincent Mcbride denies psychosocial barriers to participate in CR or self health management. No interventions needed at this time.    Expected Outcomes Vincent Mcbride will continue to have a positive attitude and outlook. He will utilize his support system as needed. He will continue to participate in volunteer activities and activities that bring him joy and utilize these activities to manage any stressors that may arise. He will continue to self assesss for psychosocial barriers and notify staff if barriers occur    Interventions Encouraged to attend Cardiac Rehabilitation for the exercise    Continue Psychosocial Services  No Follow up required           Vocational Rehabilitation: Provide vocational rehab assistance to qualifying candidates.   Vocational Rehab Evaluation & Intervention:  Vocational Rehab - 07/05/20 1509      Initial Vocational Rehab Evaluation & Intervention   Assessment shows need for Vocational Rehabilitation No           Education: Education Goals: Education classes will be provided on a weekly basis, covering required topics. Participant will state understanding/return  demonstration of topics presented.  Learning Barriers/Preferences:  Learning Barriers/Preferences - 07/05/20 1355      Learning Barriers/Preferences   Learning Barriers Sight    Learning Preferences Written Material;Skilled Demonstration;Individual Instruction  Education Topics: Count Your Pulse:  -Group instruction provided by verbal instruction, demonstration, patient participation and written materials to support subject.  Instructors address importance of being able to find your pulse and how to count your pulse when at home without a heart monitor.  Patients get hands on experience counting their pulse with staff help and individually.   Heart Attack, Angina, and Risk Factor Modification:  -Group instruction provided by verbal instruction, video, and written materials to support subject.  Instructors address signs and symptoms of angina and heart attacks.    Also discuss risk factors for heart disease and how to make changes to improve heart health risk factors.   Functional Fitness:  -Group instruction provided by verbal instruction, demonstration, patient participation, and written materials to support subject.  Instructors address safety measures for doing things around the house.  Discuss how to get up and down off the floor, how to pick things up properly, how to safely get out of a chair without assistance, and balance training.   Meditation and Mindfulness:  -Group instruction provided by verbal instruction, patient participation, and written materials to support subject.  Instructor addresses importance of mindfulness and meditation practice to help reduce stress and improve awareness.  Instructor also leads participants through a meditation exercise.    Stretching for Flexibility and Mobility:  -Group instruction provided by verbal instruction, patient participation, and written materials to support subject.  Instructors lead participants through series of  stretches that are designed to increase flexibility thus improving mobility.  These stretches are additional exercise for major muscle groups that are typically performed during regular warm up and cool down.   Hands Only CPR:  -Group verbal, video, and participation provides a basic overview of AHA guidelines for community CPR. Role-play of emergencies allow participants the opportunity to practice calling for help and chest compression technique with discussion of AED use.   Hypertension: -Group verbal and written instruction that provides a basic overview of hypertension including the most recent diagnostic guidelines, risk factor reduction with self-care instructions and medication management.    Nutrition I class: Heart Healthy Eating:  -Group instruction provided by PowerPoint slides, verbal discussion, and written materials to support subject matter. The instructor gives an explanation and review of the Therapeutic Lifestyle Changes diet recommendations, which includes a discussion on lipid goals, dietary fat, sodium, fiber, plant stanol/sterol esters, sugar, and the components of a well-balanced, healthy diet.   Nutrition II class: Lifestyle Skills:  -Group instruction provided by PowerPoint slides, verbal discussion, and written materials to support subject matter. The instructor gives an explanation and review of label reading, grocery shopping for heart health, heart healthy recipe modifications, and ways to make healthier choices when eating out.   Diabetes Question & Answer:  -Group instruction provided by PowerPoint slides, verbal discussion, and written materials to support subject matter. The instructor gives an explanation and review of diabetes co-morbidities, pre- and post-prandial blood glucose goals, pre-exercise blood glucose goals, signs, symptoms, and treatment of hypoglycemia and hyperglycemia, and foot care basics.   Diabetes Blitz:  -Group instruction provided by  PowerPoint slides, verbal discussion, and written materials to support subject matter. The instructor gives an explanation and review of the physiology behind type 1 and type 2 diabetes, diabetes medications and rational behind using different medications, pre- and post-prandial blood glucose recommendations and Hemoglobin A1c goals, diabetes diet, and exercise including blood glucose guidelines for exercising safely.    Portion Distortion:  -Group instruction provided by PowerPoint slides,  verbal discussion, written materials, and food models to support subject matter. The instructor gives an explanation of serving size versus portion size, changes in portions sizes over the last 20 years, and what consists of a serving from each food group.   Stress Management:  -Group instruction provided by verbal instruction, video, and written materials to support subject matter.  Instructors review role of stress in heart disease and how to cope with stress positively.     Exercising on Your Own:  -Group instruction provided by verbal instruction, power point, and written materials to support subject.  Instructors discuss benefits of exercise, components of exercise, frequency and intensity of exercise, and end points for exercise.  Also discuss use of nitroglycerin and activating EMS.  Review options of places to exercise outside of rehab.  Review guidelines for sex with heart disease.   Cardiac Drugs I:  -Group instruction provided by verbal instruction and written materials to support subject.  Instructor reviews cardiac drug classes: antiplatelets, anticoagulants, beta blockers, and statins.  Instructor discusses reasons, side effects, and lifestyle considerations for each drug class.   Cardiac Drugs II:  -Group instruction provided by verbal instruction and written materials to support subject.  Instructor reviews cardiac drug classes: angiotensin converting enzyme inhibitors (ACE-I), angiotensin II  receptor blockers (ARBs), nitrates, and calcium channel blockers.  Instructor discusses reasons, side effects, and lifestyle considerations for each drug class.   Anatomy and Physiology of the Circulatory System:  Group verbal and written instruction and models provide basic cardiac anatomy and physiology, with the coronary electrical and arterial systems. Review of: AMI, Angina, Valve disease, Heart Failure, Peripheral Artery Disease, Cardiac Arrhythmia, Pacemakers, and the ICD.   Other Education:  -Group or individual verbal, written, or video instructions that support the educational goals of the cardiac rehab program.   Holiday Eating Survival Tips:  -Group instruction provided by PowerPoint slides, verbal discussion, and written materials to support subject matter. The instructor gives patients tips, tricks, and techniques to help them not only survive but enjoy the holidays despite the onslaught of food that accompanies the holidays.   Knowledge Questionnaire Score:  Knowledge Questionnaire Score - 07/05/20 1354      Knowledge Questionnaire Score   Pre Score 23/24           Core Components/Risk Factors/Patient Goals at Admission:  Personal Goals and Risk Factors at Admission - 07/05/20 1510      Core Components/Risk Factors/Patient Goals on Admission    Weight Management Yes;Weight Maintenance    Intervention Weight Management: Develop a combined nutrition and exercise program designed to reach desired caloric intake, while maintaining appropriate intake of nutrient and fiber, sodium and fats, and appropriate energy expenditure required for the weight goal.;Weight Management: Provide education and appropriate resources to help participant work on and attain dietary goals.    Expected Outcomes Short Term: Continue to assess and modify interventions until short term weight is achieved;Long Term: Adherence to nutrition and physical activity/exercise program aimed toward attainment  of established weight goal;Weight Maintenance: Understanding of the daily nutrition guidelines, which includes 25-35% calories from fat, 7% or less cal from saturated fats, less than 200mg  cholesterol, less than 1.5gm of sodium, & 5 or more servings of fruits and vegetables daily;Understanding recommendations for meals to include 15-35% energy as protein, 25-35% energy from fat, 35-60% energy from carbohydrates, less than 200mg  of dietary cholesterol, 20-35 gm of total fiber daily;Understanding of distribution of calorie intake throughout the day with the consumption of 4-5  meals/snacks    Hypertension Yes    Intervention Provide education on lifestyle modifcations including regular physical activity/exercise, weight management, moderate sodium restriction and increased consumption of fresh fruit, vegetables, and low fat dairy, alcohol moderation, and smoking cessation.;Monitor prescription use compliance.    Expected Outcomes Short Term: Continued assessment and intervention until BP is < 140/29mm HG in hypertensive participants. < 130/7mm HG in hypertensive participants with diabetes, heart failure or chronic kidney disease.;Long Term: Maintenance of blood pressure at goal levels.    Lipids Yes    Intervention Provide education and support for participant on nutrition & aerobic/resistive exercise along with prescribed medications to achieve LDL 70mg , HDL >40mg .    Expected Outcomes Short Term: Participant states understanding of desired cholesterol values and is compliant with medications prescribed. Participant is following exercise prescription and nutrition guidelines.;Long Term: Cholesterol controlled with medications as prescribed, with individualized exercise RX and with personalized nutrition plan. Value goals: LDL < 70mg , HDL > 40 mg.           Core Components/Risk Factors/Patient Goals Review:   Goals and Risk Factor Review    Row Name 07/09/20 1013             Core Components/Risk  Factors/Patient Goals Review   Personal Goals Review Weight Management/Obesity;Lipids;Hypertension       Review Vincent Mcbride has multiple CAD risk factors. He is eager to participate in CR for education and exercise to manage and reduce these RF. He continues to take his medications as prescribed and attend all  medical appointments. His BMI is 26.4 on admission. He wants to maintain his current weight but work with RD to maintain a heart healthy diet for lipid reduction. His goals are to increase his stamina and strength which will improve his ability to return to yard work. His vitals remain stable and BP within exercise limits.       Expected Outcomes Vincent Mcbride will continue to participate in CR for education and exercise for RF reduction. He will meet with RD for lipid lowering, HTN dietary education. He will continue to check and record BP at home. He will attend all medical appointment and take medications as prescribed.              Core Components/Risk Factors/Patient Goals at Discharge (Final Review):   Goals and Risk Factor Review - 07/09/20 1013      Core Components/Risk Factors/Patient Goals Review   Personal Goals Review Weight Management/Obesity;Lipids;Hypertension    Review Vincent Mcbride has multiple CAD risk factors. He is eager to participate in CR for education and exercise to manage and reduce these RF. He continues to take his medications as prescribed and attend all  medical appointments. His BMI is 26.4 on admission. He wants to maintain his current weight but work with RD to maintain a heart healthy diet for lipid reduction. His goals are to increase his stamina and strength which will improve his ability to return to yard work. His vitals remain stable and BP within exercise limits.    Expected Outcomes Vincent Mcbride will continue to participate in CR for education and exercise for RF reduction. He will meet with RD for lipid lowering, HTN dietary education. He will continue  to check and record BP at home. He will attend all medical appointment and take medications as prescribed.           ITP Comments:  ITP Comments    Row Name 07/05/20 1330 07/09/20 1002  ITP Comments Dr. Fransico Him, Medical Director Vincent Mcbride completed his first cardiac rehab exercise session today and tolerated very well. VSS. Denied cardiac complaints. Worked to an RPE of 11-13. Personal goal is to increase his stamina and strength while in the program.             Comments: Pt is making expected progress toward personal goals after completing 8 sessions. Recommend continued exercise and life style modification education including  stress management and relaxation techniques to decrease cardiac risk profile. Cherre Huger, BSN Cardiac and Training and development officer

## 2020-07-27 ENCOUNTER — Other Ambulatory Visit: Payer: Self-pay

## 2020-07-27 ENCOUNTER — Encounter (HOSPITAL_COMMUNITY)
Admission: RE | Admit: 2020-07-27 | Discharge: 2020-07-27 | Disposition: A | Discharge status: Home / Self Care | Payer: Medicare Other | Source: Ambulatory Visit | Attending: Cardiology | Admitting: Cardiology

## 2020-07-27 DIAGNOSIS — I2510 Atherosclerotic heart disease of native coronary artery without angina pectoris: Secondary | ICD-10-CM

## 2020-07-27 DIAGNOSIS — Z955 Presence of coronary angioplasty implant and graft: Secondary | ICD-10-CM

## 2020-07-30 ENCOUNTER — Ambulatory Visit: Payer: Medicare Other | Admitting: Cardiology

## 2020-07-30 ENCOUNTER — Encounter: Payer: Self-pay | Admitting: Cardiology

## 2020-07-30 ENCOUNTER — Encounter (HOSPITAL_COMMUNITY)
Admission: RE | Admit: 2020-07-30 | Discharge: 2020-07-30 | Disposition: A | Discharge status: Home / Self Care | Payer: Medicare Other | Source: Ambulatory Visit | Attending: Cardiology | Admitting: Cardiology

## 2020-07-30 ENCOUNTER — Other Ambulatory Visit: Payer: Self-pay

## 2020-07-30 VITALS — BP 100/50 | HR 52 | Ht 70.5 in | Wt 191.0 lb

## 2020-07-30 DIAGNOSIS — E782 Mixed hyperlipidemia: Secondary | ICD-10-CM | POA: Diagnosis not present

## 2020-07-30 DIAGNOSIS — I493 Ventricular premature depolarization: Secondary | ICD-10-CM

## 2020-07-30 DIAGNOSIS — R5383 Other fatigue: Secondary | ICD-10-CM | POA: Diagnosis not present

## 2020-07-30 DIAGNOSIS — Z955 Presence of coronary angioplasty implant and graft: Secondary | ICD-10-CM | POA: Diagnosis present

## 2020-07-30 DIAGNOSIS — Z9861 Coronary angioplasty status: Secondary | ICD-10-CM | POA: Insufficient documentation

## 2020-07-30 DIAGNOSIS — I2510 Atherosclerotic heart disease of native coronary artery without angina pectoris: Secondary | ICD-10-CM

## 2020-07-30 DIAGNOSIS — Z79899 Other long term (current) drug therapy: Secondary | ICD-10-CM | POA: Diagnosis not present

## 2020-07-30 MED ORDER — METOPROLOL TARTRATE 25 MG PO TABS: 12.5000 mg | ORAL_TABLET | Freq: Every day | ORAL | 1 refills | Status: DC

## 2020-07-30 NOTE — Patient Instructions (Signed)
Medication Instructions:  Your physician has recommended you make the following change in your medication:  STOP: Amiodarone   START: Metoprolol tartrate 12.5 mg once daily   *If you need a refill on your cardiac medications before your next appointment, please call your pharmacy*   Lab Work: Your physician recommends that you return for lab work today: bmp, mg, tsh, lft, b12, vitamin d  If you have labs (blood work) drawn today and your tests are completely normal, you will receive your results only by: Marland Kitchen MyChart Message (if you have MyChart) OR . A paper copy in the mail If you have any lab test that is abnormal or we need to change your treatment, we will call you to review the results.   Testing/Procedures: None   Follow-Up: At [facility], you and your health needs are our priority.  As part of our continuing mission to provide you with exceptional heart care, we have created designated Provider Care Teams.  These Care Teams include your primary Cardiologist (physician) and Advanced Practice Providers (APPs -  Physician Assistants and Nurse Practitioners) who all work together to provide you with the care you need, when you need it.  We recommend signing up for the patient portal called "MyChart".  Sign up information is provided on this After Visit Summary.  MyChart is used to connect with patients for Virtual Visits (Telemedicine).  Patients are able to view lab/test results, encounter notes, upcoming appointments, etc.  Non-urgent messages can be sent to your provider as well.   To learn more about what you can do with MyChart, go to [email or web address].    Your next appointment:   1 month(s)  The format for your next appointment:   In Person  Provider:   Berniece Salines, DO   Other Instructions
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
## 2020-08-01 ENCOUNTER — Encounter (HOSPITAL_COMMUNITY)
Admission: RE | Admit: 2020-08-01 | Discharge: 2020-08-01 | Disposition: A | Discharge status: Home / Self Care | Payer: Medicare Other | Source: Ambulatory Visit | Attending: Cardiology | Admitting: Cardiology

## 2020-08-01 ENCOUNTER — Telehealth: Payer: Self-pay | Admitting: Cardiology

## 2020-08-01 ENCOUNTER — Other Ambulatory Visit: Payer: Self-pay

## 2020-08-01 DIAGNOSIS — Z955 Presence of coronary angioplasty implant and graft: Secondary | ICD-10-CM

## 2020-08-01 DIAGNOSIS — I2510 Atherosclerotic heart disease of native coronary artery without angina pectoris: Secondary | ICD-10-CM | POA: Diagnosis not present

## 2020-08-01 NOTE — Telephone Encounter (Signed)
See result note. Will close out this encounter.

## 2020-08-01 NOTE — Telephone Encounter (Signed)
Pt called in returning Christell Faith calls about his recent lab results   Best number [phone]

## 2020-08-03 ENCOUNTER — Other Ambulatory Visit: Payer: Self-pay

## 2020-08-03 ENCOUNTER — Encounter (HOSPITAL_COMMUNITY)
Admission: RE | Admit: 2020-08-03 | Discharge: 2020-08-03 | Disposition: A | Discharge status: Home / Self Care | Payer: Medicare Other | Source: Ambulatory Visit | Attending: Cardiology | Admitting: Cardiology

## 2020-08-03 DIAGNOSIS — Z9861 Coronary angioplasty status: Secondary | ICD-10-CM

## 2020-08-03 DIAGNOSIS — I2510 Atherosclerotic heart disease of native coronary artery without angina pectoris: Secondary | ICD-10-CM

## 2020-08-03 DIAGNOSIS — Z955 Presence of coronary angioplasty implant and graft: Secondary | ICD-10-CM

## 2020-08-06 ENCOUNTER — Other Ambulatory Visit: Payer: Self-pay

## 2020-08-06 ENCOUNTER — Encounter (HOSPITAL_COMMUNITY)
Admission: RE | Admit: 2020-08-06 | Discharge: 2020-08-06 | Disposition: A | Discharge status: Home / Self Care | Payer: Medicare Other | Source: Ambulatory Visit | Attending: Cardiology | Admitting: Cardiology

## 2020-08-06 DIAGNOSIS — Z955 Presence of coronary angioplasty implant and graft: Secondary | ICD-10-CM

## 2020-08-06 DIAGNOSIS — I2510 Atherosclerotic heart disease of native coronary artery without angina pectoris: Secondary | ICD-10-CM | POA: Diagnosis not present

## 2020-08-06 LAB — BASIC METABOLIC PANEL
BUN/Creatinine Ratio: 16 (ref 10–24)
BUN: 19 mg/dL (ref 8–27)
CO2: 24 mmol/L (ref 20–29)
Calcium: 9.4 mg/dL (ref 8.6–10.2)
Chloride: 105 mmol/L (ref 96–106)
Creatinine, Ser: 1.19 mg/dL (ref 0.76–1.27)
GFR calc Af Amer: 67 mL/min/{1.73_m2} (ref >59)
GFR calc non Af Amer: 58 mL/min/{1.73_m2} — ABNORMAL LOW (ref >59)
Glucose: 141 mg/dL — ABNORMAL HIGH (ref 65–99)
Potassium: 4.2 mmol/L (ref 3.5–5.2)
Sodium: 141 mmol/L (ref 134–144)

## 2020-08-06 LAB — HEPATIC FUNCTION PANEL
ALT: 23 IU/L (ref 0–44)
AST: 17 IU/L (ref 0–40)
Albumin: 4.4 g/dL (ref 3.7–4.7)
Alkaline Phosphatase: 78 IU/L (ref 44–121)
Bilirubin Total: 0.6 mg/dL (ref 0.0–1.2)
Bilirubin, Direct: 0.28 mg/dL (ref 0.00–0.40)
Total Protein: 6.2 g/dL (ref 6.0–8.5)

## 2020-08-06 LAB — MAGNESIUM: Magnesium: 2.6 mg/dL — ABNORMAL HIGH (ref 1.6–2.3)

## 2020-08-06 LAB — VITAMIN D 25 HYDROXY (VIT D DEFICIENCY, FRACTURES): Vit D, 25-Hydroxy: 39.5 ng/mL (ref 30.0–100.0)

## 2020-08-06 LAB — TSH: TSH: 2.02 u[IU]/mL (ref 0.450–4.500)

## 2020-08-06 LAB — VITAMIN B1: Thiamine: 159.5 nmol/L (ref 66.5–200.0)

## 2020-08-08 ENCOUNTER — Encounter (HOSPITAL_COMMUNITY)
Admission: RE | Admit: 2020-08-08 | Discharge: 2020-08-08 | Disposition: A | Discharge status: Home / Self Care | Payer: Medicare Other | Source: Ambulatory Visit | Attending: Cardiology | Admitting: Cardiology

## 2020-08-08 ENCOUNTER — Other Ambulatory Visit: Payer: Self-pay

## 2020-08-08 DIAGNOSIS — I2510 Atherosclerotic heart disease of native coronary artery without angina pectoris: Secondary | ICD-10-CM | POA: Diagnosis not present

## 2020-08-08 DIAGNOSIS — Z955 Presence of coronary angioplasty implant and graft: Secondary | ICD-10-CM

## 2020-08-10 ENCOUNTER — Encounter (HOSPITAL_COMMUNITY)
Admission: RE | Admit: 2020-08-10 | Discharge: 2020-08-10 | Disposition: A | Discharge status: Home / Self Care | Payer: Medicare Other | Source: Ambulatory Visit | Attending: Cardiology | Admitting: Cardiology

## 2020-08-10 ENCOUNTER — Other Ambulatory Visit: Payer: Self-pay

## 2020-08-10 DIAGNOSIS — I2510 Atherosclerotic heart disease of native coronary artery without angina pectoris: Secondary | ICD-10-CM | POA: Diagnosis not present

## 2020-08-10 DIAGNOSIS — Z955 Presence of coronary angioplasty implant and graft: Secondary | ICD-10-CM

## 2020-08-13 ENCOUNTER — Other Ambulatory Visit: Payer: Self-pay

## 2020-08-13 ENCOUNTER — Encounter (HOSPITAL_COMMUNITY)
Admission: RE | Admit: 2020-08-13 | Discharge: 2020-08-13 | Disposition: A | Discharge status: Home / Self Care | Payer: Medicare Other | Source: Ambulatory Visit | Attending: Cardiology | Admitting: Cardiology

## 2020-08-13 DIAGNOSIS — I2510 Atherosclerotic heart disease of native coronary artery without angina pectoris: Secondary | ICD-10-CM | POA: Diagnosis not present

## 2020-08-13 DIAGNOSIS — Z955 Presence of coronary angioplasty implant and graft: Secondary | ICD-10-CM

## 2020-08-15 ENCOUNTER — Encounter (HOSPITAL_COMMUNITY)
Admission: RE | Admit: 2020-08-15 | Discharge: 2020-08-15 | Disposition: A | Discharge status: Home / Self Care | Payer: Medicare Other | Source: Ambulatory Visit | Attending: Cardiology | Admitting: Cardiology

## 2020-08-15 ENCOUNTER — Other Ambulatory Visit: Payer: Self-pay

## 2020-08-15 DIAGNOSIS — Z955 Presence of coronary angioplasty implant and graft: Secondary | ICD-10-CM

## 2020-08-15 DIAGNOSIS — I2510 Atherosclerotic heart disease of native coronary artery without angina pectoris: Secondary | ICD-10-CM | POA: Diagnosis not present

## 2020-08-17 ENCOUNTER — Encounter (HOSPITAL_COMMUNITY)
Admission: RE | Admit: 2020-08-17 | Discharge: 2020-08-17 | Disposition: A | Discharge status: Home / Self Care | Payer: Medicare Other | Source: Ambulatory Visit | Attending: Cardiology | Admitting: Cardiology

## 2020-08-17 ENCOUNTER — Other Ambulatory Visit: Payer: Self-pay

## 2020-08-17 DIAGNOSIS — Z955 Presence of coronary angioplasty implant and graft: Secondary | ICD-10-CM

## 2020-08-17 DIAGNOSIS — I2510 Atherosclerotic heart disease of native coronary artery without angina pectoris: Secondary | ICD-10-CM | POA: Diagnosis not present

## 2020-08-20 ENCOUNTER — Encounter (HOSPITAL_COMMUNITY)
Admission: RE | Admit: 2020-08-20 | Discharge: 2020-08-20 | Disposition: A | Discharge status: Home / Self Care | Payer: Medicare Other | Source: Ambulatory Visit | Attending: Cardiology | Admitting: Cardiology

## 2020-08-20 ENCOUNTER — Other Ambulatory Visit: Payer: Self-pay

## 2020-08-20 DIAGNOSIS — I2510 Atherosclerotic heart disease of native coronary artery without angina pectoris: Secondary | ICD-10-CM

## 2020-08-20 DIAGNOSIS — Z955 Presence of coronary angioplasty implant and graft: Secondary | ICD-10-CM

## 2020-08-20 DIAGNOSIS — Z9861 Coronary angioplasty status: Secondary | ICD-10-CM

## 2020-08-21 NOTE — Progress Notes (Signed)
Cardiac Individual Treatment Plan  Patient Details  Name: Vincent Mcbride MRN: [id number] Date of Birth: June 19, 1941 Referring Provider:     CARDIAC REHAB PHASE II ORIENTATION from 07/05/2020 in [facility]  Referring Provider Berniece Salines, DO      Initial Encounter Date:    CARDIAC REHAB PHASE II ORIENTATION from 07/05/2020 in [place]  Date 07/05/20      Visit Diagnosis: 05/23/20 S/P DESx2 LAD, CFX  CAD S/P percutaneous coronary angioplasty  Patient's Home Medications on Admission:  Current Outpatient Medications:  .  acetaminophen (TYLENOL) 500 MG tablet, Take 500 mg by mouth every 6 (six) hours as needed (for severe headaches). , Disp: , Rfl:  .  aspirin EC 81 MG tablet, Take 81 mg by mouth every other day. , Disp: , Rfl:  .  atorvastatin (LIPITOR) 40 MG tablet, Take 1 tablet (40 mg total) by mouth daily., Disp: 90 tablet, Rfl: 2 .  Cholecalciferol (VITAMIN D3) 50 MCG (2000 UT) TABS, Take 2,000 Units by mouth daily. , Disp: , Rfl:  .  clopidogrel (PLAVIX) 75 MG tablet, Take 1 tablet (75 mg total) by mouth daily with breakfast., Disp: 90 tablet, Rfl: 3 .  Coenzyme Q10 100 MG capsule, Take 100 mg by mouth daily. , Disp: , Rfl:  .  Cyanocobalamin (VITAMIN B-12 PO), Take 1 tablet by mouth daily as needed (to boost energy). , Disp: , Rfl:  .  fexofenadine (ALLEGRA) 180 MG tablet, Take 180 mg by mouth daily. , Disp: , Rfl:  .  Horse Chestnut 300 MG CAPS, Take 300 mg by mouth daily. , Disp: , Rfl:  .  metoprolol tartrate (LOPRESSOR) 25 MG tablet, Take 0.5 tablets (12.5 mg total) by mouth at bedtime., Disp: 90 tablet, Rfl: 1 .  Multiple Vitamins-Minerals (EMERGEN-C VITAMIN C) PACK, Take 1 packet by mouth daily as needed (to boost the immune system). , Disp: , Rfl:  .  Multiple Vitamins-Minerals (MULTIVITAMIN PO), Take 1 tablet by mouth daily with breakfast. , Disp: , Rfl:  .  Multiple Vitamins-Minerals (PRESERVISION/LUTEIN  PO), Take 1 capsule by mouth daily. , Disp: , Rfl:  .  nitroGLYCERIN (NITROSTAT) 0.4 MG SL tablet, Place 1 tablet (0.4 mg total) under the tongue every 5 (five) minutes x 3 doses as needed for chest pain., Disp: 25 tablet, Rfl: 0 .  Wheat Dextrin (BENEFIBER) POWD, Take by mouth See admin instructions. Mix 1 teaspoonful into the morning cup of coffee and drink , Disp: , Rfl:  .  Zinc 100 MG TABS, Take 1 tablet by mouth daily. , Disp: , Rfl:   Past Medical History: Past Medical History:  Diagnosis Date  . Abnormal nuclear stress test   . Benign prostatic hyperplasia with urinary obstruction 05/30/2014  . CAD S/P percutaneous coronary angioplasty 05/23/2020   To LAD and LCx 05/23/20  . Calculus of kidney 05/30/2014  . Chest pain 05/22/2020  . Chest pain of uncertain etiology 0/32/1224  . DDD (degenerative disc disease), cervical 05/30/2014  . Dysuria 05/30/2014  . Essential hypertension 05/29/2020  . HLD (hyperlipidemia) 05/23/2020  . Mixed hyperlipidemia 05/09/2020  . Neurogenic bladder 05/30/2014  . 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 Precordial pain 05/09/2020  . Prostatitis 05/30/2014  . Shortness of breath 05/09/2020    Tobacco Use: Social History   Tobacco Use  Smoking Status Never Smoker  Smokeless Tobacco Never Used    Labs: Recent Review  Flowsheet Data    Labs for ITP Cardiac and Pulmonary Rehab Latest Ref Rng & Units 05/23/2020   Cholestrol 0 - 200 mg/dL 118   LDLCALC 0 - 99 mg/dL 72   HDL >40 mg/dL 40(L)   Trlycerides <150 mg/dL 28      Capillary Blood Glucose: No results found for: GLUCAP   Exercise Target Goals: Exercise Program Goal: Individual exercise prescription set using results from initial 6 min walk test and THRR while considering  patient's activity barriers and safety.   Exercise Prescription Goal: Initial exercise prescription builds to 30-45 minutes a day of aerobic activity, 2-3 days per week.  Home exercise  guidelines will be given to patient during program as part of exercise prescription that the participant will acknowledge.  Activity Barriers & Risk Stratification:  Activity Barriers & Cardiac Risk Stratification - 07/05/20 1351      Activity Barriers & Cardiac Risk Stratification   Activity Barriers Neck/Spine Problems    Cardiac Risk Stratification Moderate           6 Minute Walk:  6 Minute Walk    Row Name 07/05/20 1350         6 Minute Walk   Phase Initial     Distance 1378 feet     Walk Time 6 minutes     # of Rest Breaks 0     MPH 2.6     METS 2.3     RPE 12     Perceived Dyspnea  0     VO2 Peak 8.2     Symptoms No     Resting HR 49 bpm     Resting BP 120/60     Resting Oxygen Saturation  98 %     Exercise Oxygen Saturation  during 6 min walk 99 %     Max Ex. HR 73 bpm     Max Ex. BP 120/60     2 Minute Post BP 112/62            Oxygen Initial Assessment:   Oxygen Re-Evaluation:   Oxygen Discharge (Final Oxygen Re-Evaluation):   Initial Exercise Prescription:  Initial Exercise Prescription - 07/05/20 1300      Date of Initial Exercise RX and Referring Provider   Date 07/05/20    Referring Provider Berniece Salines, DO    Expected Discharge Date 08/31/20      Treadmill   MPH 1.8    Grade 0    Minutes 15    METs 2.63      NuStep   Level 2    SPM 85    Minutes 15    METs 2.5      Prescription Details   Frequency (times per week) 3x    Duration Progress to 30 minutes of continuous aerobic without signs/symptoms of physical distress      Intensity   THRR 40-80% of Max Heartrate 56-113    Ratings of Perceived Exertion 11-13    Perceived Dyspnea 0-4      Progression   Progression Continue progressive overload as per policy without signs/symptoms or physical distress.      Resistance Training   Training Prescription Yes    Weight 3lbs    Reps 10-15           Perform Capillary Blood Glucose checks as needed.  Exercise  Prescription Changes:  Exercise Prescription Changes    Row Name 07/09/20 [phone] 07/16/20 0701 07/30/20 0704 08/13/20 0701  Response to Exercise   Blood Pressure (Admit) 122/48 108/58 122/62 104/52    Blood Pressure (Exercise) 138/68 110/70 144/62 166/60    Blood Pressure (Exit) 106/50 100/60 102/46 102/60    Heart Rate (Admit) 68 bpm 50 bpm 65 bpm 64 bpm    Heart Rate (Exercise) 88 bpm 92 bpm 86 bpm 122 bpm    Heart Rate (Exit) 61 bpm 59 bpm 63 bpm 71 bpm    Rating of Perceived Exertion (Exercise) _0 Symptoms none none none none    Comments Off to a good start with exercise. -- -- --    Duration Continue with 30 min of aerobic exercise without signs/symptoms of physical distress. Continue with 30 min of aerobic exercise without signs/symptoms of physical distress. Continue with 30 min of aerobic exercise without signs/symptoms of physical distress. Continue with 30 min of aerobic exercise without signs/symptoms of physical distress.    Intensity THRR unchanged THRR unchanged THRR unchanged THRR unchanged      Progression   Progression Continue to progress workloads to maintain intensity without signs/symptoms of physical distress. Continue to progress workloads to maintain intensity without signs/symptoms of physical distress. Continue to progress workloads to maintain intensity without signs/symptoms of physical distress. Continue to progress workloads to maintain intensity without signs/symptoms of physical distress.    Average METs 2.3 2.6 3.2 3.8      Resistance Training   Training Prescription Yes Yes Yes Yes    Weight 3lbs 3lbs 4 lbs 4 lbs    Reps 10-15 10-15 10-15 10-15    Time 10 Minutes 10 Minutes 10 Minutes 10 Minutes      Interval Training   Interval Training No No No No      Treadmill   MPH 1.8 1.8 2 2.5    Grade 0 0 1 1.5    Minutes _1 METs 2.38 2.38 2.81 3.43      NuStep   Level _2 SPM 85 85 85 85    Minutes _3 METs 2.3 2.8 3.6 4.2      Home Exercise Plan   Plans to continue exercise at -- -- Home (comment)  Walking Home (comment)  Walking    Frequency -- -- Add 4 additional days to program exercise sessions. Add 2 additional days to program exercise sessions.    Initial Home Exercises Provided -- -- 07/18/20 07/18/20           Exercise Comments:  Exercise Comments    Row Name 07/09/20 0802 07/18/20 0721 08/13/20 1610       Exercise Comments Patient tolerated first session of exercise well without symptoms. Reviewed home exercise guidelines, METs, and goals with patient. Reviewed METs and goals with patient.            Exercise Goals and Review:  Exercise Goals    Row Name 07/05/20 1351             Exercise Goals   Increase Physical Activity Yes       Intervention Provide advice, education, support and counseling about physical activity/exercise needs.;Develop an individualized exercise prescription for aerobic and resistive training based on initial evaluation findings, risk stratification, comorbidities and participant's personal goals.       Expected Outcomes Short Term: Attend rehab on a regular basis to increase amount of physical activity.;Long Term: Add in home exercise to  make exercise part of routine and to increase amount of physical activity.;Long Term: Exercising regularly at least 3-5 days a week.       Increase Strength and Stamina Yes       Intervention Provide advice, education, support and counseling about physical activity/exercise needs.;Develop an individualized exercise prescription for aerobic and resistive training based on initial evaluation findings, risk stratification, comorbidities and participant's personal goals.       Expected Outcomes Short Term: Increase workloads from initial exercise prescription for resistance, speed, and METs.;Short Term: Perform resistance training exercises routinely during rehab and add in resistance training at home;Long Term:  Improve cardiorespiratory fitness, muscular endurance and strength as measured by increased METs and functional capacity (6MWT)       Able to understand and use rate of perceived exertion (RPE) scale Yes       Intervention Provide education and explanation on how to use RPE scale       Expected Outcomes Short Term: Able to use RPE daily in rehab to express subjective intensity level;Long Term:  Able to use RPE to guide intensity level when exercising independently       Knowledge and understanding of Target Heart Rate Range (THRR) Yes       Intervention Provide education and explanation of THRR including how the numbers were predicted and where they are located for reference       Expected Outcomes Short Term: Able to state/look up THRR;Short Term: Able to use daily as guideline for intensity in rehab;Long Term: Able to use THRR to govern intensity when exercising independently       Able to check pulse independently Yes       Intervention Review the importance of being able to check your own pulse for safety during independent exercise;Provide education and demonstration on how to check pulse in carotid and radial arteries.       Expected Outcomes Short Term: Able to explain why pulse checking is important during independent exercise;Long Term: Able to check pulse independently and accurately       Understanding of Exercise Prescription Yes       Intervention Provide education, explanation, and written materials on patient's individual exercise prescription       Expected Outcomes Short Term: Able to explain program exercise prescription;Long Term: Able to explain home exercise prescription to exercise independently              Exercise Goals Re-Evaluation :  Exercise Goals Re-Evaluation    Row Name 07/09/20 0802 07/18/20 0721 08/13/20 0808         Exercise Goal Re-Evaluation   Exercise Goals Review Increase Physical Activity;Able to understand and use rate of perceived exertion (RPE)  scale Increase Physical Activity;Able to understand and use rate of perceived exertion (RPE) scale;Increase Strength and Stamina;Knowledge and understanding of Target Heart Rate Range (THRR);Able to check pulse independently;Understanding of Exercise Prescription Increase Physical Activity;Able to understand and use rate of perceived exertion (RPE) scale;Increase Strength and Stamina;Knowledge and understanding of Target Heart Rate Range (THRR);Able to check pulse independently;Understanding of Exercise Prescription     Comments Patient able to understand and use RPE scale appropriately. Reviewed home exercise guidelines with patient including endpoints, temperature precautions, target heart rate and rate of perceived exertion. Patient is currently walking 25 minutes most days of the week weather permitting  as his mode of home exercise. We discussed increasing duration from 25 to 30 minutes, and patient is amenable to this. Patient has a monitor  to check his pulse. Patient is looking into buying 3 or 4 lb weights to do his resistance training at home. Patient is not currently walking at home, although he was prior to starting the program. I encouraged patient to add back in 1-2 days walking at least 30 minutes, and patient is amenable to this. Patient got 5 lb weights that he's using at home for his resistance training 1-2 days/week.     Expected Outcomes [place] workloads as tolerated to help improve cardiorespiratory fitness. Patient will increase walking at home from 25 minutes to 30 minutes. Patient will add a 30 minute walk 1-2 days/week at home in addition to exercise at cardiac rehab to help increase strength and stamina.            Discharge Exercise Prescription (Final Exercise Prescription Changes):  Exercise Prescription Changes - 08/13/20 0701      Response to Exercise   Blood Pressure (Admit) 104/52    Blood Pressure (Exercise) 166/60    Blood Pressure (Exit) 102/60    Heart Rate  (Admit) 64 bpm    Heart Rate (Exercise) 122 bpm    Heart Rate (Exit) 71 bpm    Rating of Perceived Exertion (Exercise) 12    Symptoms none    Duration Continue with 30 min of aerobic exercise without signs/symptoms of physical distress.    Intensity THRR unchanged      Progression   Progression Continue to progress workloads to maintain intensity without signs/symptoms of physical distress.    Average METs 3.8      Resistance Training   Training Prescription Yes    Weight 4 lbs    Reps 10-15    Time 10 Minutes      Interval Training   Interval Training No      Treadmill   MPH 2.5    Grade 1.5    Minutes 15    METs 3.43      NuStep   Level 5    SPM 85    Minutes 15    METs 4.2      Home Exercise Plan   Plans to continue exercise at Home (comment)   Walking   Frequency Add 2 additional days to program exercise sessions.    Initial Home Exercises Provided 07/18/20           Nutrition:  Target Goals: Understanding of nutrition guidelines, daily intake of sodium <1550m, cholesterol <2045m calories 30% from fat and 7% or less from saturated fats, daily to have 5 or more servings of fruits and vegetables.  Biometrics:  Pre Biometrics - 07/05/20 1351      Pre Biometrics   Height 5' 10.5" (1.791 m)    Weight 85 kg    Waist Circumference 41 inches    Hip Circumference 42 inches    Waist to Hip Ratio 0.98 %    BMI (Calculated) 26.5    Triceps Skinfold 10 mm    % Body Fat 26.1 %    Grip Strength 38 kg    Flexibility 0 in    Single Leg Stand 6.56 seconds            Nutrition Therapy Plan and Nutrition Goals:  Nutrition Therapy & Goals - 07/13/20 0948      Nutrition Therapy   Diet Heart Healthy    Drug/Food Interactions Statins/Certain Fruits      Personal Nutrition Goals   Nutrition Goal Pt to build a healthy plate including vegetables, fruits, whole  grains, and low-fat dairy products in a heart healthy meal plan.      Intervention Plan   Intervention  Prescribe, educate and counsel regarding individualized specific dietary modifications aiming towards targeted core components such as weight, hypertension, lipid management, diabetes, heart failure and other comorbidities.;Nutrition handout(s) given to patient.    Expected Outcomes Short Term Goal: Understand basic principles of dietary content, such as calories, fat, sodium, cholesterol and nutrients.           Nutrition Assessments:  Nutrition Assessments - 08/13/20 1424      MEDFICTS Scores   Pre Score 30          MEDIFICTS Score Key:  ?70 Need to make dietary changes   40-70 Heart Healthy Diet  ? 40 Therapeutic Level Cholesterol Diet    Picture Your Plate Scores:  <93 Unhealthy dietary pattern with much room for improvement.  41-50 Dietary pattern unlikely to meet recommendations for good health and room for improvement.  51-60 More healthful dietary pattern, with some room for improvement.   >60 Healthy dietary pattern, although there may be some specific behaviors that could be improved.    Nutrition Goals Re-Evaluation:  Nutrition Goals Re-Evaluation    [place] Name 07/13/20 0948 07/24/20 1430 08/16/20 1427         Goals   Current Weight 187 lb (84.8 kg) 187 lb 2.7 oz (84.9 kg) 185 lb 13.6 oz (84.3 kg)     Nutrition Goal -- Pt to build a healthy plate including vegetables, fruits, whole grains, and low-fat dairy products in a heart healthy meal plan. Pt to build a healthy plate including vegetables, fruits, whole grains, and low-fat dairy products in a heart healthy meal plan.     Comment -- -- Pt requesting Plate Method handout            Nutrition Goals Re-Evaluation:  Nutrition Goals Re-Evaluation    Row Name 07/13/20 0948 07/24/20 1430 08/16/20 1427         Goals   Current Weight 187 lb (84.8 kg) 187 lb 2.7 oz (84.9 kg) 185 lb 13.6 oz (84.3 kg)     Nutrition Goal -- Pt to build a healthy plate including vegetables, fruits, whole grains, and low-fat  dairy products in a heart healthy meal plan. Pt to build a healthy plate including vegetables, fruits, whole grains, and low-fat dairy products in a heart healthy meal plan.     Comment -- -- Pt requesting Plate Method handout            Nutrition Goals Discharge (Final Nutrition Goals Re-Evaluation):  Nutrition Goals Re-Evaluation - 08/16/20 1427      Goals   Current Weight 185 lb 13.6 oz (84.3 kg)    Nutrition Goal Pt to build a healthy plate including vegetables, fruits, whole grains, and low-fat dairy products in a heart healthy meal plan.    Comment Pt requesting Plate Method handout           Psychosocial: Target Goals: Acknowledge presence or absence of significant depression and/or stress, maximize coping skills, provide positive support system. Participant is able to verbalize types and ability to use techniques and skills needed for reducing stress and depression.  Initial Review & Psychosocial Screening:  Initial Psych Review & Screening - 07/05/20 1507      Initial Review   Current issues with None Identified      Family Dynamics   Good Support System? Yes   Yechiel lives at at home  with his wife who supports him     Barriers   Psychosocial barriers to participate in program There are no identifiable barriers or psychosocial needs.      Screening Interventions   Interventions Encouraged to exercise           Quality of Life Scores:  Quality of Life - 07/05/20 1354      Quality of Life   Select Quality of Life      Quality of Life Scores   Health/Function Pre 22.9 %    Socioeconomic Pre 27.36 %    Psych/Spiritual Pre 26.86 %    Family Pre 26.4 %    GLOBAL Pre 25.15 %          Scores of 19 and below usually indicate a poorer quality of life in these areas.  A difference of  2-3 points is a clinically meaningful difference.  A difference of 2-3 points in the total score of the Quality of Life Index has been associated with significant improvement in  overall quality of life, self-image, physical symptoms, and general health in studies assessing change in quality of life.  PHQ-9: Recent Review Flowsheet Data   There is no flowsheet data to display.    Interpretation of Total Score  Total Score Depression Severity:  1-4 = Minimal depression, 5-9 = Mild depression, 10-14 = Moderate depression, 15-19 = Moderately severe depression, 20-27 = Severe depression   Psychosocial Evaluation and Intervention:  Psychosocial Evaluation - 07/09/20 1007      Psychosocial Evaluation & Interventions   Interventions Encouraged to exercise with the program and follow exercise prescription    Comments Mr. Rinkenberger presents to his first cardiac rehab exercise session with a positive attitude and outlook. He admits to having a strong support system at home. He enjoys playing with his son's dog and doing volunteer work with his church. Mr. Granquist denies psychosocial barriers to participate in CR or self health management. No interventions needed at this time.    Expected Outcomes Mr. Gruenwald will continue to have a positive attitude and outlook. He will utilize his support system as needed. He will continue to participate in volunteer activities and activities that bring him joy and utilize these activities to manage any stressors that may arise. He will continue to self assesss for psychosocial barriers and notify staff if barriers occur           Psychosocial Re-Evaluation:  Psychosocial Re-Evaluation    Thomas Name 07/09/20 1012 08/14/20 1501           Psychosocial Re-Evaluation   Current issues with None Identified None Identified      Comments Mr. Grassel presents to his first cardiac rehab exercise session with a positive attitude and outlook. He admits to having a strong support system at home. He enjoys playing with his son's dog and doing volunteer work with his church. Mr. Asebedo denies psychosocial barriers to participate in CR or self  health management. No interventions needed at this time. Mr. Calvillo continues to have a positive outlook and attitude. He denies changes to his support system. He continues to do volunteer work with his church. No psychosocial barriers to self health management or participation in CR identified. No interventions needed.      Expected Outcomes Mr. Lewinski will continue to have a positive attitude and outlook. He will utilize his support system as needed. He will continue to participate in volunteer activities and activities that bring him joy and utilize  these activities to manage any stressors that may arise. He will continue to self assesss for psychosocial barriers and notify staff if barriers occur Mr. Skillman will continue to have a positive attitude and outlook. He will utilize his support system as needed. He will continue to participate in volunteer activities and activities that bring him joy and utilize these activities to manage any stressors that may arise. He will continue to self assesss for psychosocial barriers and notify staff if barriers occur      Interventions Encouraged to attend Cardiac Rehabilitation for the exercise --      Continue Psychosocial Services  No Follow up required --             Psychosocial Discharge (Final Psychosocial Re-Evaluation):  Psychosocial Re-Evaluation - 08/14/20 1501      Psychosocial Re-Evaluation   Current issues with None Identified    Comments Mr. Fawver continues to have a positive outlook and attitude. He denies changes to his support system. He continues to do volunteer work with his church. No psychosocial barriers to self health management or participation in CR identified. No interventions needed.    Expected Outcomes Mr. Bedrosian will continue to have a positive attitude and outlook. He will utilize his support system as needed. He will continue to participate in volunteer activities and activities that bring him joy and utilize these  activities to manage any stressors that may arise. He will continue to self assesss for psychosocial barriers and notify staff if barriers occur           Vocational Rehabilitation: Provide vocational rehab assistance to qualifying candidates.   Vocational Rehab Evaluation & Intervention:  Vocational Rehab - 07/05/20 1509      Initial Vocational Rehab Evaluation & Intervention   Assessment shows need for Vocational Rehabilitation No           Education: Education Goals: Education classes will be provided on a weekly basis, covering required topics. Participant will state understanding/return demonstration of topics presented.  Learning Barriers/Preferences:  Learning Barriers/Preferences - 07/05/20 1355      Learning Barriers/Preferences   Learning Barriers Sight    Learning Preferences Written Material;Skilled Demonstration;Individual Instruction           Education Topics: Count Your Pulse:  -Group instruction provided by verbal instruction, demonstration, patient participation and written materials to support subject.  Instructors address importance of being able to find your pulse and how to count your pulse when at home without a heart monitor.  Patients get hands on experience counting their pulse with staff help and individually.   Heart Attack, Angina, and Risk Factor Modification:  -Group instruction provided by verbal instruction, video, and written materials to support subject.  Instructors address signs and symptoms of angina and heart attacks.    Also discuss risk factors for heart disease and how to make changes to improve heart health risk factors.   Functional Fitness:  -Group instruction provided by verbal instruction, demonstration, patient participation, and written materials to support subject.  Instructors address safety measures for doing things around the house.  Discuss how to get up and down off the floor, how to pick things up properly, how to  safely get out of a chair without assistance, and balance training.   Meditation and Mindfulness:  -Group instruction provided by verbal instruction, patient participation, and written materials to support subject.  Instructor addresses importance of mindfulness and meditation practice to help reduce stress and improve awareness.  Instructor also leads  participants through a meditation exercise.    Stretching for Flexibility and Mobility:  -Group instruction provided by verbal instruction, patient participation, and written materials to support subject.  Instructors lead participants through series of stretches that are designed to increase flexibility thus improving mobility.  These stretches are additional exercise for major muscle groups that are typically performed during regular warm up and cool down.   Hands Only CPR:  -Group verbal, video, and participation provides a basic overview of AHA guidelines for community CPR. Role-play of emergencies allow participants the opportunity to practice calling for help and chest compression technique with discussion of AED use.   Hypertension: -Group verbal and written instruction that provides a basic overview of hypertension including the most recent diagnostic guidelines, risk factor reduction with self-care instructions and medication management.    Nutrition I class: Heart Healthy Eating:  -Group instruction provided by PowerPoint slides, verbal discussion, and written materials to support subject matter. The instructor gives an explanation and review of the Therapeutic Lifestyle Changes diet recommendations, which includes a discussion on lipid goals, dietary fat, sodium, fiber, plant stanol/sterol esters, sugar, and the components of a well-balanced, healthy diet.   Nutrition II class: Lifestyle Skills:  -Group instruction provided by PowerPoint slides, verbal discussion, and written materials to support subject matter. The instructor gives  an explanation and review of label reading, grocery shopping for heart health, heart healthy recipe modifications, and ways to make healthier choices when eating out.   Diabetes Question & Answer:  -Group instruction provided by PowerPoint slides, verbal discussion, and written materials to support subject matter. The instructor gives an explanation and review of diabetes co-morbidities, pre- and post-prandial blood glucose goals, pre-exercise blood glucose goals, signs, symptoms, and treatment of hypoglycemia and hyperglycemia, and foot care basics.   Diabetes Blitz:  -Group instruction provided by PowerPoint slides, verbal discussion, and written materials to support subject matter. The instructor gives an explanation and review of the physiology behind type 1 and type 2 diabetes, diabetes medications and rational behind using different medications, pre- and post-prandial blood glucose recommendations and Hemoglobin A1c goals, diabetes diet, and exercise including blood glucose guidelines for exercising safely.    Portion Distortion:  -Group instruction provided by PowerPoint slides, verbal discussion, written materials, and food models to support subject matter. The instructor gives an explanation of serving size versus portion size, changes in portions sizes over the last 20 years, and what consists of a serving from each food group.   Stress Management:  -Group instruction provided by verbal instruction, video, and written materials to support subject matter.  Instructors review role of stress in heart disease and how to cope with stress positively.     Exercising on Your Own:  -Group instruction provided by verbal instruction, power point, and written materials to support subject.  Instructors discuss benefits of exercise, components of exercise, frequency and intensity of exercise, and end points for exercise.  Also discuss use of nitroglycerin and activating EMS.  Review options of places  to exercise outside of rehab.  Review guidelines for sex with heart disease.   Cardiac Drugs I:  -Group instruction provided by verbal instruction and written materials to support subject.  Instructor reviews cardiac drug classes: antiplatelets, anticoagulants, beta blockers, and statins.  Instructor discusses reasons, side effects, and lifestyle considerations for each drug class.   Cardiac Drugs II:  -Group instruction provided by verbal instruction and written materials to support subject.  Instructor reviews cardiac drug classes: angiotensin converting enzyme  inhibitors (ACE-I), angiotensin II receptor blockers (ARBs), nitrates, and calcium channel blockers.  Instructor discusses reasons, side effects, and lifestyle considerations for each drug class.   Anatomy and Physiology of the Circulatory System:  Group verbal and written instruction and models provide basic cardiac anatomy and physiology, with the coronary electrical and arterial systems. Review of: AMI, Angina, Valve disease, Heart Failure, Peripheral Artery Disease, Cardiac Arrhythmia, Pacemakers, and the ICD.   Other Education:  -Group or individual verbal, written, or video instructions that support the educational goals of the cardiac rehab program.   Holiday Eating Survival Tips:  -Group instruction provided by PowerPoint slides, verbal discussion, and written materials to support subject matter. The instructor gives patients tips, tricks, and techniques to help them not only survive but enjoy the holidays despite the onslaught of food that accompanies the holidays.   Knowledge Questionnaire Score:  Knowledge Questionnaire Score - 07/05/20 1354      Knowledge Questionnaire Score   Pre Score 23/24           Core Components/Risk Factors/Patient Goals at Admission:  Personal Goals and Risk Factors at Admission - 07/05/20 1510      Core Components/Risk Factors/Patient Goals on Admission    Weight Management  Yes;Weight Maintenance    Intervention Weight Management: Develop a combined nutrition and exercise program designed to reach desired caloric intake, while maintaining appropriate intake of nutrient and fiber, sodium and fats, and appropriate energy expenditure required for the weight goal.;Weight Management: Provide education and appropriate resources to help participant work on and attain dietary goals.    Expected Outcomes Short Term: Continue to assess and modify interventions until short term weight is achieved;Long Term: Adherence to nutrition and physical activity/exercise program aimed toward attainment of established weight goal;Weight Maintenance: Understanding of the daily nutrition guidelines, which includes 25-35% calories from fat, 7% or less cal from saturated fats, less than 235m cholesterol, less than 1.5gm of sodium, & 5 or more servings of fruits and vegetables daily;Understanding recommendations for meals to include 15-35% energy as protein, 25-35% energy from fat, 35-60% energy from carbohydrates, less than 2067mof dietary cholesterol, 20-35 gm of total fiber daily;Understanding of distribution of calorie intake throughout the day with the consumption of 4-5 meals/snacks    Hypertension Yes    Intervention Provide education on lifestyle modifcations including regular physical activity/exercise, weight management, moderate sodium restriction and increased consumption of fresh fruit, vegetables, and low fat dairy, alcohol moderation, and smoking cessation.;Monitor prescription use compliance.    Expected Outcomes Short Term: Continued assessment and intervention until BP is < 140/9051mG in hypertensive participants. < 130/41m67m in hypertensive participants with diabetes, heart failure or chronic kidney disease.;Long Term: Maintenance of blood pressure at goal levels.    Lipids Yes    Intervention Provide education and support for participant on nutrition & aerobic/resistive exercise  along with prescribed medications to achieve LDL <70mg75mL >40mg.58mExpected Outcomes Short Term: Participant states understanding of desired cholesterol values and is compliant with medications prescribed. Participant is following exercise prescription and nutrition guidelines.;Long Term: Cholesterol controlled with medications as prescribed, with individualized exercise RX and with personalized nutrition plan. Value goals: LDL < 70mg, 47m> 40 mg.           Core Components/Risk Factors/Patient Goals Review:   Goals and Risk Factor Review    Row Name 07/09/20 1013 08/14/20 1503           Core Components/Risk Factors/Patient Goals Review  Personal Goals Review Weight Management/Obesity;Lipids;Hypertension Weight Management/Obesity;Lipids;Hypertension      Review Mr. Makarewicz has multiple CAD risk factors. He is eager to participate in CR for education and exercise to manage and reduce these RF. He continues to take his medications as prescribed and attend all  medical appointments. His BMI is 26.4 on admission. He wants to maintain his current weight but work with RD to maintain a heart healthy diet for lipid reduction. His goals are to increase his stamina and strength which will improve his ability to return to yard work. His vitals remain stable and BP within exercise limits. Mr. Dicenzo has multiple CAD risk factors. He continues to be eager to participate in CR for education and exercise to manage and reduce these RF. He continues to take his medications as prescribed and attend all  medical appointments. His admission weight was 85.0 anc current weight 84.3. He wants to maintain his current weight but work with RD to maintain a heart healthy diet for lipid reduction. He has met with RD and modified his diet as encouraged. His goals are to increase his stamina and strength which will improve his ability to return to yard work. He is on track to meet his goals at discharge. His vitals remain  stable and BP within exercise limits.      Expected Outcomes Mr. Sato will continue to participate in CR for education and exercise for RF reduction. He will meet with RD for lipid lowering, HTN dietary education. He will continue to check and record BP at home. He will attend all medical appointment and take medications as prescribed. Mr. Lastinger will continue to participate in CR for education and exercise for RF reduction. He will meet with RD for lipid lowering, HTN dietary education. He will continue to check and record BP at home. He will attend all medical appointment and take medications as prescribed.             Core Components/Risk Factors/Patient Goals at Discharge (Final Review):   Goals and Risk Factor Review - 08/14/20 1503      Core Components/Risk Factors/Patient Goals Review   Personal Goals Review Weight Management/Obesity;Lipids;Hypertension    Review Mr. Weick has multiple CAD risk factors. He continues to be eager to participate in CR for education and exercise to manage and reduce these RF. He continues to take his medications as prescribed and attend all  medical appointments. His admission weight was 85.0 anc current weight 84.3. He wants to maintain his current weight but work with RD to maintain a heart healthy diet for lipid reduction. He has met with RD and modified his diet as encouraged. His goals are to increase his stamina and strength which will improve his ability to return to yard work. He is on track to meet his goals at discharge. His vitals remain stable and BP within exercise limits.    Expected Outcomes Mr. Foxworth will continue to participate in CR for education and exercise for RF reduction. He will meet with RD for lipid lowering, HTN dietary education. He will continue to check and record BP at home. He will attend all medical appointment and take medications as prescribed.           ITP Comments:  ITP Comments    Row Name 07/05/20 1330  07/09/20 1002 08/14/20 1439       ITP Comments Dr. Fransico Him, Medical Director Mr. Hutchinson completed his first cardiac rehab exercise session today and tolerated very well.  VSS. Denied cardiac complaints. Worked to an RPE of 11-13. Personal goal is to increase his stamina and strength while in the program. Mr. Ferencz has completed 16 cardiac rehab exercise sessions since admission. He is scheduled to graduate from cardiac rehab 09/10/20 after an 8 week enrollment. He is tolerating work load increases and averages 3.4 to 4.1 mets and consistantly works to an RPE of 11 to 13. VS remain stable and patient denies cardiac complaints. Rhythm remains stable however patient occassionaly has PVCs/ bigemny. He continues to walk a couple days a week and does 5 lb hand weighs and stretches at home.            Comments: See ITP comments

## 2020-08-22 ENCOUNTER — Encounter (HOSPITAL_COMMUNITY)
Admission: RE | Admit: 2020-08-22 | Discharge: 2020-08-22 | Disposition: A | Discharge status: Home / Self Care | Payer: Medicare Other | Source: Ambulatory Visit | Attending: Cardiology | Admitting: Cardiology

## 2020-08-22 ENCOUNTER — Other Ambulatory Visit: Payer: Self-pay

## 2020-08-22 DIAGNOSIS — Z9861 Coronary angioplasty status: Secondary | ICD-10-CM

## 2020-08-22 DIAGNOSIS — I2510 Atherosclerotic heart disease of native coronary artery without angina pectoris: Secondary | ICD-10-CM

## 2020-08-22 DIAGNOSIS — Z955 Presence of coronary angioplasty implant and graft: Secondary | ICD-10-CM

## 2020-08-24 ENCOUNTER — Encounter (HOSPITAL_COMMUNITY): Payer: Medicare Other

## 2020-08-27 ENCOUNTER — Encounter (HOSPITAL_COMMUNITY)
Admission: RE | Admit: 2020-08-27 | Discharge: 2020-08-27 | Disposition: A | Discharge status: Home / Self Care | Payer: Medicare Other | Source: Ambulatory Visit | Attending: Cardiology | Admitting: Cardiology

## 2020-08-27 ENCOUNTER — Other Ambulatory Visit: Payer: Self-pay

## 2020-08-27 DIAGNOSIS — Z955 Presence of coronary angioplasty implant and graft: Secondary | ICD-10-CM

## 2020-08-27 DIAGNOSIS — Z9861 Coronary angioplasty status: Secondary | ICD-10-CM

## 2020-08-27 DIAGNOSIS — I2510 Atherosclerotic heart disease of native coronary artery without angina pectoris: Secondary | ICD-10-CM | POA: Diagnosis not present

## 2020-08-29 ENCOUNTER — Other Ambulatory Visit: Payer: Self-pay

## 2020-08-29 ENCOUNTER — Encounter (HOSPITAL_COMMUNITY)
Admission: RE | Admit: 2020-08-29 | Discharge: 2020-08-29 | Disposition: A | Discharge status: Home / Self Care | Payer: Medicare Other | Source: Ambulatory Visit | Attending: Cardiology | Admitting: Cardiology

## 2020-08-29 DIAGNOSIS — Z955 Presence of coronary angioplasty implant and graft: Secondary | ICD-10-CM | POA: Insufficient documentation

## 2020-08-29 DIAGNOSIS — Z9861 Coronary angioplasty status: Secondary | ICD-10-CM | POA: Diagnosis present

## 2020-08-29 DIAGNOSIS — I2510 Atherosclerotic heart disease of native coronary artery without angina pectoris: Secondary | ICD-10-CM | POA: Insufficient documentation

## 2020-08-31 ENCOUNTER — Ambulatory Visit: Payer: Medicare Other | Admitting: Cardiology

## 2020-08-31 ENCOUNTER — Encounter (HOSPITAL_COMMUNITY)
Admission: RE | Admit: 2020-08-31 | Discharge: 2020-08-31 | Disposition: A | Discharge status: Home / Self Care | Payer: Medicare Other | Source: Ambulatory Visit | Attending: Cardiology | Admitting: Cardiology

## 2020-08-31 ENCOUNTER — Other Ambulatory Visit: Payer: Self-pay

## 2020-08-31 ENCOUNTER — Encounter: Payer: Self-pay | Admitting: Cardiology

## 2020-08-31 VITALS — BP 110/68 | HR 75 | Ht 70.0 in | Wt 193.0 lb

## 2020-08-31 VITALS — BP 128/50 | HR 60 | Ht 70.5 in | Wt 189.2 lb

## 2020-08-31 DIAGNOSIS — I493 Ventricular premature depolarization: Secondary | ICD-10-CM | POA: Diagnosis not present

## 2020-08-31 DIAGNOSIS — I2510 Atherosclerotic heart disease of native coronary artery without angina pectoris: Secondary | ICD-10-CM | POA: Diagnosis not present

## 2020-08-31 DIAGNOSIS — Z955 Presence of coronary angioplasty implant and graft: Secondary | ICD-10-CM | POA: Diagnosis not present

## 2020-08-31 DIAGNOSIS — Z9861 Coronary angioplasty status: Secondary | ICD-10-CM

## 2020-08-31 MED ORDER — METOPROLOL TARTRATE 25 MG PO TABS: 12.5000 mg | ORAL_TABLET | Freq: Two times a day (BID) | ORAL | 3 refills | Status: DC

## 2020-08-31 NOTE — Progress Notes (Signed)
Discharge Progress Report  Patient Details  Name: Vincent Mcbride MRN: [id number] Date of Birth: 1940-12-19 Referring Provider:     Avis from 07/05/2020 in [place]  Referring Provider Berniece Salines, DO       Number of Visits: 23 of 24 sessions  Reason for Discharge:  Patient reached a stable level of exercise. Patient independent in their exercise. Patient has met program and personal goals.  Smoking History:  Social History   Tobacco Use  Smoking Status Never Smoker  Smokeless Tobacco Never Used    Diagnosis:  05/23/20 S/P DESx2 LAD, CFX  CAD S/P percutaneous coronary angioplasty  ADL UCSD:   Initial Exercise Prescription:  Initial Exercise Prescription - 07/05/20 1300      Date of Initial Exercise RX and Referring Provider   Date 07/05/20    Referring Provider Berniece Salines, DO    Expected Discharge Date 08/31/20      Treadmill   MPH 1.8    Grade 0    Minutes 15    METs 2.63      NuStep   Level 2    SPM 85    Minutes 15    METs 2.5      Prescription Details   Frequency (times per week) 3x    Duration Progress to 30 minutes of continuous aerobic without signs/symptoms of physical distress      Intensity   THRR 40-80% of Max Heartrate 56-113    Ratings of Perceived Exertion 11-13    Perceived Dyspnea 0-4      Progression   Progression Continue progressive overload as per policy without signs/symptoms or physical distress.      Resistance Training   Training Prescription Yes    Weight 3lbs    Reps 10-15           Discharge Exercise Prescription (Final Exercise Prescription Changes):  Exercise Prescription Changes - 08/27/20 0703      Response to Exercise   Blood Pressure (Admit) 100/62    Blood Pressure (Exercise) 148/62    Blood Pressure (Exit) 98/58    Heart Rate (Admit) 63 bpm    Heart Rate (Exercise) 104 bpm    Heart Rate (Exit) 62 bpm    Rating of Perceived Exertion  (Exercise) 12    Symptoms none    Duration Continue with 30 min of aerobic exercise without signs/symptoms of physical distress.    Intensity THRR unchanged      Progression   Progression Continue to progress workloads to maintain intensity without signs/symptoms of physical distress.    Average METs 3.6      Resistance Training   Training Prescription Yes    Weight 4 lbs    Reps 10-15    Time 10 Minutes      Interval Training   Interval Training No      Treadmill   MPH 2.5    Grade 1.5    Minutes 15    METs 3.43      NuStep   Level 5    SPM 85    Minutes 15    METs 3.8      Track   Laps 8   1684 feet   Minutes 6   Walk Test   METs 3.44      Home Exercise Plan   Plans to continue exercise at Home (comment)   Walking   Frequency Add 2 additional days to program exercise  sessions.    Initial Home Exercises Provided 07/18/20           Functional Capacity:  6 Minute Walk    Row Name 07/05/20 1350 08/27/20 0724       6 Minute Walk   Phase Initial Discharge    Distance 1378 feet 1684 feet    Distance % Change -- 22.21 %    Distance Feet Change -- 306 ft    Walk Time 6 minutes 6 minutes    # of Rest Breaks 0 0    MPH 2.6 3.19    METS 2.3 3.76    RPE 12 12    Perceived Dyspnea  0 0    VO2 Peak 8.2 13.15    Symptoms No No    Resting HR 49 bpm 63 bpm    Resting BP 120/60 100/62    Resting Oxygen Saturation  98 % --    Exercise Oxygen Saturation  during 6 min walk 99 % --    Max Ex. HR 73 bpm 99 bpm    Max Ex. BP 120/60 148/62    2 Minute Post BP 112/62 98/58           Psychological, QOL, Others - Outcomes: PHQ 2/9: No flowsheet data found.  Quality of Life:  Quality of Life - 08/27/20 1033      Quality of Life   Select Quality of Life      Quality of Life Scores   Health/Function Pre 22.9 %    Health/Function Post 24.5 %    Health/Function % Change 6.99 %    Socioeconomic Pre 27.36 %    Socioeconomic Post 26.14 %    Socioeconomic %  Change  -4.46 %    Psych/Spiritual Pre 26.86 %    Psych/Spiritual Post 25.5 %    Psych/Spiritual % Change -5.06 %    Family Pre 26.4 %    Family Post 27.6 %    Family % Change 4.55 %    GLOBAL Pre 25.15 %    GLOBAL Post 25.53 %    GLOBAL % Change 1.51 %           Personal Goals: Goals established at orientation with interventions provided to work toward goal.  Personal Goals and Risk Factors at Admission - 07/05/20 1510      Core Components/Risk Factors/Patient Goals on Admission    Weight Management Yes;Weight Maintenance    Intervention Weight Management: Develop a combined nutrition and exercise program designed to reach desired caloric intake, while maintaining appropriate intake of nutrient and fiber, sodium and fats, and appropriate energy expenditure required for the weight goal.;Weight Management: Provide education and appropriate resources to help participant work on and attain dietary goals.    Expected Outcomes Short Term: Continue to assess and modify interventions until short term weight is achieved;Long Term: Adherence to nutrition and physical activity/exercise program aimed toward attainment of established weight goal;Weight Maintenance: Understanding of the daily nutrition guidelines, which includes 25-35% calories from fat, 7% or less cal from saturated fats, less than 271m cholesterol, less than 1.5gm of sodium, & 5 or more servings of fruits and vegetables daily;Understanding recommendations for meals to include 15-35% energy as protein, 25-35% energy from fat, 35-60% energy from carbohydrates, less than 2026mof dietary cholesterol, 20-35 gm of total fiber daily;Understanding of distribution of calorie intake throughout the day with the consumption of 4-5 meals/snacks    Hypertension Yes    Intervention Provide education on lifestyle modifcations  including regular physical activity/exercise, weight management, moderate sodium restriction and increased consumption of  fresh fruit, vegetables, and low fat dairy, alcohol moderation, and smoking cessation.;Monitor prescription use compliance.    Expected Outcomes Short Term: Continued assessment and intervention until BP is < 140/25m HG in hypertensive participants. < 130/873mHG in hypertensive participants with diabetes, heart failure or chronic kidney disease.;Long Term: Maintenance of blood pressure at goal levels.    Lipids Yes    Intervention Provide education and support for participant on nutrition & aerobic/resistive exercise along with prescribed medications to achieve LDL <7074mHDL >45m66m  Expected Outcomes Short Term: Participant states understanding of desired cholesterol values and is compliant with medications prescribed. Participant is following exercise prescription and nutrition guidelines.;Long Term: Cholesterol controlled with medications as prescribed, with individualized exercise RX and with personalized nutrition plan. Value goals: LDL < 70mg45mL > 40 mg.            Personal Goals Discharge:  Goals and Risk Factor Review    Row Name 07/09/20 1013 08/14/20 1503           Core Components/Risk Factors/Patient Goals Review   Personal Goals Review Weight Management/Obesity;Lipids;Hypertension Weight Management/Obesity;Lipids;Hypertension      Review Mr. TroutLundbergmultiple CAD risk factors. He is eager to participate in CR for education and exercise to manage and reduce these RF. He continues to take his medications as prescribed and attend all  medical appointments. His BMI is 26.4 on admission. He wants to maintain his current weight but work with RD to maintain a heart healthy diet for lipid reduction. His goals are to increase his stamina and strength which will improve his ability to return to yard work. His vitals remain stable and BP within exercise limits. Mr. TroutBolyardmultiple CAD risk factors. He continues to be eager to participate in CR for education and exercise to manage  and reduce these RF. He continues to take his medications as prescribed and attend all  medical appointments. His admission weight was 85.0 anc current weight 84.3. He wants to maintain his current weight but work with RD to maintain a heart healthy diet for lipid reduction. He has met with RD and modified his diet as encouraged. His goals are to increase his stamina and strength which will improve his ability to return to yard work. He is on track to meet his goals at discharge. His vitals remain stable and BP within exercise limits.      Expected Outcomes Mr. TroutLanghorst continue to participate in CR for education and exercise for RF reduction. He will meet with RD for lipid lowering, HTN dietary education. He will continue to check and record BP at home. He will attend all medical appointment and take medications as prescribed. Mr. TroutRepass continue to participate in CR for education and exercise for RF reduction. He will meet with RD for lipid lowering, HTN dietary education. He will continue to check and record BP at home. He will attend all medical appointment and take medications as prescribed.             Exercise Goals and Review:  Exercise Goals    Row Name 07/05/20 1351             Exercise Goals   Increase Physical Activity Yes       Intervention Provide advice, education, support and counseling about physical activity/exercise needs.;Develop an individualized exercise prescription for aerobic and resistive training based on initial  evaluation findings, risk stratification, comorbidities and participant's personal goals.       Expected Outcomes Short Term: Attend rehab on a regular basis to increase amount of physical activity.;Long Term: Add in home exercise to make exercise part of routine and to increase amount of physical activity.;Long Term: Exercising regularly at least 3-5 days a week.       Increase Strength and Stamina Yes       Intervention Provide advice, education,  support and counseling about physical activity/exercise needs.;Develop an individualized exercise prescription for aerobic and resistive training based on initial evaluation findings, risk stratification, comorbidities and participant's personal goals.       Expected Outcomes Short Term: Increase workloads from initial exercise prescription for resistance, speed, and METs.;Short Term: Perform resistance training exercises routinely during rehab and add in resistance training at home;Long Term: Improve cardiorespiratory fitness, muscular endurance and strength as measured by increased METs and functional capacity (6MWT)       Able to understand and use rate of perceived exertion (RPE) scale Yes       Intervention Provide education and explanation on how to use RPE scale       Expected Outcomes Short Term: Able to use RPE daily in rehab to express subjective intensity level;Long Term:  Able to use RPE to guide intensity level when exercising independently       Knowledge and understanding of Target Heart Rate Range (THRR) Yes       Intervention Provide education and explanation of THRR including how the numbers were predicted and where they are located for reference       Expected Outcomes Short Term: Able to state/look up THRR;Short Term: Able to use daily as guideline for intensity in rehab;Long Term: Able to use THRR to govern intensity when exercising independently       Able to check pulse independently Yes       Intervention Review the importance of being able to check your own pulse for safety during independent exercise;Provide education and demonstration on how to check pulse in carotid and radial arteries.       Expected Outcomes Short Term: Able to explain why pulse checking is important during independent exercise;Long Term: Able to check pulse independently and accurately       Understanding of Exercise Prescription Yes       Intervention Provide education, explanation, and written materials  on patient's individual exercise prescription       Expected Outcomes Short Term: Able to explain program exercise prescription;Long Term: Able to explain home exercise prescription to exercise independently              Exercise Goals Re-Evaluation:  Exercise Goals Re-Evaluation    Row Name 07/09/20 0802 07/18/20 0721 08/13/20 0808 08/27/20 0803       Exercise Goal Re-Evaluation   Exercise Goals Review Increase Physical Activity;Able to understand and use rate of perceived exertion (RPE) scale Increase Physical Activity;Able to understand and use rate of perceived exertion (RPE) scale;Increase Strength and Stamina;Knowledge and understanding of Target Heart Rate Range (THRR);Able to check pulse independently;Understanding of Exercise Prescription Increase Physical Activity;Able to understand and use rate of perceived exertion (RPE) scale;Increase Strength and Stamina;Knowledge and understanding of Target Heart Rate Range (THRR);Able to check pulse independently;Understanding of Exercise Prescription Increase Physical Activity;Able to understand and use rate of perceived exertion (RPE) scale;Increase Strength and Stamina;Knowledge and understanding of Target Heart Rate Range (THRR);Able to check pulse independently;Understanding of Exercise Prescription    Comments  Patient able to understand and use RPE scale appropriately. Reviewed home exercise guidelines with patient including endpoints, temperature precautions, target heart rate and rate of perceived exertion. Patient is currently walking 25 minutes most days of the week weather permitting  as his mode of home exercise. We discussed increasing duration from 25 to 30 minutes, and patient is amenable to this. Patient has a monitor to check his pulse. Patient is looking into buying 3 or 4 lb weights to do his resistance training at home. Patient is not currently walking at home, although he was prior to starting the program. I encouraged patient to  add back in 1-2 days walking at least 30 minutes, and patient is amenable to this. Patient got 5 lb weights that he's using at home for his resistance training 1-2 days/week. Patient will complete the cardiac rehab program on 08/31/20 and had progressed well. Patient's functional capacity increased 22% as measured by 6 minute walk test and strength increased 12% as measured by grip strength test. Patient has a treadmill, stationary bike, and 5 lb weights at home, and also has Silver Sneakers. Patient feels that his strength and stamina has improved some, and he has been able to [employer] using his riding mower.    Expected Outcomes Porgress workloads as tolerated to help improve cardiorespiratory fitness. Patient will increase walking at home from 25 minutes to 30 minutes. Patient will add a 30 minute walk 1-2 days/week at home in addition to exercise at cardiac rehab to help increase strength and stamina. Patient plans to exercise at least 30 minutes, at least 4 days/week upon completion of the cardiac rehab program.           Nutrition & Weight - Outcomes:  Pre Biometrics - 07/05/20 1351      Pre Biometrics   Height 5' 10.5" (1.791 m)    Weight 85 kg    Waist Circumference 41 inches    Hip Circumference 42 inches    Waist to Hip Ratio 0.98 %    BMI (Calculated) 26.5    Triceps Skinfold 10 mm    % Body Fat 26.1 %    Grip Strength 38 kg    Flexibility 0 in    Single Leg Stand 6.56 seconds           Post Biometrics - 08/27/20 0735       Post  Biometrics   Waist Circumference 38 inches    Hip Circumference 42.25 inches    Waist to Hip Ratio 0.9 %    Triceps Skinfold 16.5 mm    Grip Strength 42.5 kg    Flexibility 10.25 in    Single Leg Stand 5.31 seconds           Nutrition:  Nutrition Therapy & Goals - 07/13/20 0948      Nutrition Therapy   Diet Heart Healthy    Drug/Food Interactions Statins/Certain Fruits      Personal Nutrition Goals   Nutrition Goal Pt to build a  healthy plate including vegetables, fruits, whole grains, and low-fat dairy products in a heart healthy meal plan.      Intervention Plan   Intervention Prescribe, educate and counsel regarding individualized specific dietary modifications aiming towards targeted core components such as weight, hypertension, lipid management, diabetes, heart failure and other comorbidities.;Nutrition handout(s) given to patient.    Expected Outcomes Short Term Goal: Understand basic principles of dietary content, such as calories, fat, sodium, cholesterol and nutrients.  Nutrition Discharge:  Nutrition Assessments - 08/13/20 1424      MEDFICTS Scores   Pre Score 30           Education Questionnaire Score:  Knowledge Questionnaire Score - 08/27/20 1032      Knowledge Questionnaire Score   Pre Score 23/24    Post Score 23/24           Goals reviewed with patient; copy given to patient.

## 2020-08-31 NOTE — Patient Instructions (Addendum)
Medication Instructions:  Your physician has recommended you make the following change in your medication:  1. INCREASE METOPROLOL TO 12.5 MG TWICE DAILY.   *If you need a refill on your cardiac medications before your next appointment, please call your pharmacy*   Lab Work: NONE If you have labs (blood work) drawn today and your tests are completely normal, you will receive your results only by: Marland Kitchen MyChart Message (if you have MyChart) OR . A paper copy in the mail If you have any lab test that is abnormal or we need to change your treatment, we will call you to review the results.   Testing/Procedures: NONE   Follow-Up: At [facility], you and your health needs are our priority.  As part of our continuing mission to provide you with exceptional heart care, we have created designated Provider Care Teams.  These Care Teams include your primary Cardiologist (physician) and Advanced Practice Providers (APPs -  Physician Assistants and Nurse Practitioners) who all work together to provide you with the care you need, when you need it.  We recommend signing up for the patient portal called "MyChart".  Sign up information is provided on this After Visit Summary.  MyChart is used to connect with patients for Virtual Visits (Telemedicine).  Patients are able to view lab/test results, encounter notes, upcoming appointments, etc.  Non-urgent messages can be sent to your provider as well.   To learn more about what you can do with MyChart, go to [email or web address].    Your next appointment:   3 month(s)  The format for your next appointment:   In Person  Provider:   Kardie Tobb, DO   YOU ARE BEING REFERRED TO SEE DR. Curt Bears
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
## 2020-10-01 ENCOUNTER — Encounter: Payer: Self-pay | Admitting: Cardiology

## 2020-10-01 ENCOUNTER — Ambulatory Visit: Payer: Medicare Other | Admitting: Cardiology

## 2020-10-01 ENCOUNTER — Institutional Professional Consult (permissible substitution): Payer: Medicare Other | Admitting: Cardiology

## 2020-10-01 ENCOUNTER — Other Ambulatory Visit: Payer: Self-pay

## 2020-10-01 VITALS — BP 124/64 | HR 60 | Ht 70.0 in | Wt 197.8 lb

## 2020-10-01 DIAGNOSIS — I493 Ventricular premature depolarization: Secondary | ICD-10-CM | POA: Diagnosis not present

## 2020-10-01 MED ORDER — MEXILETINE HCL 250 MG PO CAPS: 250.0000 mg | ORAL_CAPSULE | Freq: Two times a day (BID) | ORAL | 11 refills | Status: DC

## 2020-10-01 NOTE — Patient Instructions (Signed)
Medication Instructions:  Your physician has recommended you make the following change in your medication:   1.  START taking mexiletine 250 mg- Take one capsule by mouth twice a day  *If you need a refill on your cardiac medications before your next appointment, please call your pharmacy*  Lab Work: None ordered. If you have labs (blood work) drawn today and your tests are completely normal, you will receive your results only by: Marland Kitchen MyChart Message (if you have MyChart) OR . A paper copy in the mail If you have any lab test that is abnormal or we need to change your treatment, we will call you to review the results.  Testing/Procedures: None ordered.  Follow-Up: At [facility], you and your health needs are our priority.  As part of our continuing mission to provide you with exceptional heart care, we have created designated Provider Care Teams.  These Care Teams include your primary Cardiologist (physician) and Advanced Practice Providers (APPs -  Physician Assistants and Nurse Practitioners) who all work together to provide you with the care you need, when you need it.  We recommend signing up for the patient portal called "MyChart".  Sign up information is provided on this After Visit Summary.  MyChart is used to connect with patients for Virtual Visits (Telemedicine).  Patients are able to view lab/test results, encounter notes, upcoming appointments, etc.  Non-urgent messages can be sent to your provider as well.   To learn more about what you can do with MyChart, go to [email or web address].    Your next appointment:   3 month(s)  The format for your next appointment:   In Person  Provider:   Loman Brooklyn, MD    Mexiletine capsules What is this medicine? MEXILETINE (mex [place] e teen) is an antiarrhythmic agent. This medicine is used to treat irregular heart rhythm and can slow rapid heartbeats. It can help your heart to return to and maintain a normal rhythm. Because of the  side effects caused by this medicine, it is usually used for heartbeat problems that may be life-threatening. This medicine may be used for other purposes; ask your health care provider or pharmacist if you have questions. COMMON BRAND NAME(S): Mexitil What should I tell my health care provider before I take this medicine? They need to know if you have any of these conditions:  liver disease  other heart problems  previous heart attack  an unusual or allergic reaction to mexiletine, other medicines, foods, dyes, or preservatives  pregnant or trying to get pregnant  breast-feeding How should I use this medicine? Take this medicine by mouth with a glass of water. Follow the directions on the prescription label. It is recommended that you take this medicine with food or an antacid. Take your doses at regular intervals. Do not take your medicine more often than directed. Do not stop taking except on the advice of your doctor or health care professional. Talk to your pediatrician regarding the use of this medicine in children. Special care may be needed. Overdosage: If you think you have taken too much of this medicine contact a poison control center or emergency room at once. NOTE: This medicine is only for you. Do not share this medicine with others. What if I miss a dose? If you miss a dose, take it as soon as you can. If it is almost time for your next dose, take only that dose. Do not take double or extra doses. What may interact with this  medicine? Do not take this medicine with any of the following medications:  dofetilide This medicine may also interact with the following medications:  caffeine  cimetidine  medicines for depression, anxiety, or psychotic disturbances  medicines to control heart rhythm  phenobarbital  phenytoin  rifampin  theophylline This list may not describe all possible interactions. Give your health care provider a list of all the medicines, herbs,  non-prescription drugs, or dietary supplements you use. Also tell them if you smoke, drink alcohol, or use illegal drugs. Some items may interact with your medicine. What should I watch for while using this medicine? Your condition will be monitored closely when you first begin therapy. Often, this drug is first started in a hospital or other monitored health care setting. Once you are on maintenance therapy, visit your doctor or health care provider for regular checks on your progress. Because your condition and use of this medicine carry some risk, it is a good idea to carry an identification card, necklace or bracelet with details of your condition, medications, and doctor or health care provider. You may get drowsy or dizzy. Do not drive, use machinery, or do anything that needs mental alertness until you know how this medicine affects you. Do not stand or sit up quickly, especially if you are an older patient. This reduces the risk of dizzy or fainting spells. Alcohol can make you more dizzy, increase flushing and rapid heartbeats. Avoid alcoholic drinks. This medicine may cause serious skin reactions. They can happen weeks to months after starting the medicine. Contact your health care provider right away if you notice fevers or flu-like symptoms with a rash. The rash may be red or purple and then turn into blisters or peeling of the skin. Or, you might notice a red rash with swelling of the face, lips or lymph nodes in your neck or under your arms. What side effects may I notice from receiving this medicine? Side effects that you should report to your doctor or health care professional as soon as possible:  allergic reactions like skin rash, itching or hives, swelling of the face, lips, or tongue  breathing problems  chest pain, continued irregular heartbeats  rash, fever, and swollen lymph nodes  redness, blistering, peeling or loosening of the skin, including inside the  mouth  seizures  skin rash  trembling, shaking  unusual bleeding or bruising  unusually weak or tired Side effects that usually do not require medical attention (report to your doctor or health care professional if they continue or are bothersome):  blurred vision  difficulty walking  heartburn  nausea, vomiting  nervousness  numbness, or tingling in the fingers or toes This list may not describe all possible side effects. Call your doctor for medical advice about side effects. You may report side effects to FDA at 1-800-FDA-1088. Where should I keep my medicine? Keep out of reach of children. Store at room temperature between 15 and 30 degrees C (59 and 86 degrees F). Throw away any unused medicine after the expiration date. NOTE: This sheet is a summary. It may not cover all possible information. If you have questions about this medicine, talk to your doctor, pharmacist, or health care provider.  2020 Elsevier/Gold Standard (2018-12-22 09:25:42)
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
## 2020-10-25 ENCOUNTER — Institutional Professional Consult (permissible substitution): Payer: Medicare Other | Admitting: Cardiology

## 2020-10-26 ENCOUNTER — Telehealth: Payer: Self-pay | Admitting: Cardiology

## 2020-10-26 NOTE — Telephone Encounter (Signed)
Per request, records faxed to [facility] [phone]

## 2020-11-28 DIAGNOSIS — D126 Benign neoplasm of colon, unspecified: Secondary | ICD-10-CM | POA: Insufficient documentation

## 2020-11-28 DIAGNOSIS — E559 Vitamin D deficiency, unspecified: Secondary | ICD-10-CM | POA: Insufficient documentation

## 2020-11-28 DIAGNOSIS — Z860100 Personal history of colon polyps, unspecified: Secondary | ICD-10-CM | POA: Insufficient documentation

## 2020-11-28 DIAGNOSIS — R7301 Impaired fasting glucose: Secondary | ICD-10-CM | POA: Insufficient documentation

## 2020-11-28 DIAGNOSIS — E786 Lipoprotein deficiency: Secondary | ICD-10-CM | POA: Insufficient documentation

## 2020-11-28 DIAGNOSIS — Z800 Family history of malignant neoplasm of digestive organs: Secondary | ICD-10-CM | POA: Insufficient documentation

## 2020-11-28 DIAGNOSIS — K219 Gastro-esophageal reflux disease without esophagitis: Secondary | ICD-10-CM | POA: Insufficient documentation

## 2020-12-04 ENCOUNTER — Other Ambulatory Visit: Payer: Self-pay

## 2020-12-04 ENCOUNTER — Ambulatory Visit: Payer: Medicare Other | Admitting: Cardiology

## 2020-12-04 ENCOUNTER — Encounter: Payer: Self-pay | Admitting: Cardiology

## 2020-12-04 VITALS — BP 122/60 | HR 60 | Ht 70.0 in | Wt 192.0 lb

## 2020-12-04 DIAGNOSIS — I2510 Atherosclerotic heart disease of native coronary artery without angina pectoris: Secondary | ICD-10-CM

## 2020-12-04 DIAGNOSIS — I493 Ventricular premature depolarization: Secondary | ICD-10-CM | POA: Diagnosis not present

## 2020-12-04 DIAGNOSIS — I10 Essential (primary) hypertension: Secondary | ICD-10-CM | POA: Diagnosis not present

## 2020-12-04 DIAGNOSIS — E782 Mixed hyperlipidemia: Secondary | ICD-10-CM

## 2020-12-04 DIAGNOSIS — Z9861 Coronary angioplasty status: Secondary | ICD-10-CM

## 2020-12-04 NOTE — Patient Instructions (Signed)
Medication Instructions:  Your physician recommends that you continue on your current medications as directed. Please refer to the Current Medication list given to you today.  *If you need a refill on your cardiac medications before your next appointment, please call your pharmacy*   Lab Work: Your physician recommends that you return for lab work: TODAY: BMET,Mag, CBC If you have labs (blood work) drawn today and your tests are completely normal, you will receive your results only by: . MyChart Message (if you have MyChart) OR . A paper copy in the mail If you have any lab test that is abnormal or we need to change your treatment, we will call you to review the results.   Testing/Procedures: None   Follow-Up: At [facility], you and your health needs are our priority.  As part of our continuing mission to provide you with exceptional heart care, we have created designated Provider Care Teams.  These Care Teams include your primary Cardiologist (physician) and Advanced Practice Providers (APPs -  Physician Assistants and Nurse Practitioners) who all work together to provide you with the care you need, when you need it.  We recommend signing up for the patient portal called "MyChart".  Sign up information is provided on this After Visit Summary.  MyChart is used to connect with patients for Virtual Visits (Telemedicine).  Patients are able to view lab/test results, encounter notes, upcoming appointments, etc.  Non-urgent messages can be sent to your provider as well.   To learn more about what you can do with MyChart, go to [email or web address].    Your next appointment:   6 month(s)  The format for your next appointment:   In Person  Provider:   Kardie Tobb, DO   Other Instructions   
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
## 2020-12-05 ENCOUNTER — Telehealth: Payer: Self-pay | Admitting: Cardiology

## 2020-12-05 ENCOUNTER — Telehealth: Payer: Self-pay

## 2020-12-05 LAB — CBC WITH DIFFERENTIAL/PLATELET
Basophils Absolute: 0 10*3/uL (ref 0.0–0.2)
Basos: 1 %
EOS (ABSOLUTE): 0.1 10*3/uL (ref 0.0–0.4)
Eos: 2 %
Hematocrit: 37.3 % — ABNORMAL LOW (ref 37.5–51.0)
Hemoglobin: 12.9 g/dL — ABNORMAL LOW (ref 13.0–17.7)
Immature Grans (Abs): 0 10*3/uL (ref 0.0–0.1)
Immature Granulocytes: 0 %
Lymphocytes Absolute: 1.3 10*3/uL (ref 0.7–3.1)
Lymphs: 19 %
MCH: 32.6 pg (ref 26.6–33.0)
MCHC: 34.6 g/dL (ref 31.5–35.7)
MCV: 94 fL (ref 79–97)
Monocytes Absolute: 0.6 10*3/uL (ref 0.1–0.9)
Monocytes: 9 %
Neutrophils Absolute: 4.5 10*3/uL (ref 1.4–7.0)
Neutrophils: 69 %
Platelets: 192 10*3/uL (ref 150–450)
RBC: 3.96 x10E6/uL — ABNORMAL LOW (ref 4.14–5.80)
RDW: 12.4 % (ref 11.6–15.4)
WBC: 6.5 10*3/uL (ref 3.4–10.8)

## 2020-12-05 LAB — BASIC METABOLIC PANEL
BUN/Creatinine Ratio: 14 (ref 10–24)
BUN: 16 mg/dL (ref 8–27)
CO2: 25 mmol/L (ref 20–29)
Calcium: 9.6 mg/dL (ref 8.6–10.2)
Chloride: 104 mmol/L (ref 96–106)
Creatinine, Ser: 1.15 mg/dL (ref 0.76–1.27)
Glucose: 136 mg/dL — ABNORMAL HIGH (ref 65–99)
Potassium: 4.4 mmol/L (ref 3.5–5.2)
Sodium: 143 mmol/L (ref 134–144)
eGFR: 64 mL/min/{1.73_m2} (ref >59)

## 2020-12-05 LAB — MAGNESIUM: Magnesium: 1.9 mg/dL (ref 1.6–2.3)

## 2020-12-05 NOTE — Telephone Encounter (Signed)
Left message on patients voicemail to please return our call.   

## 2020-12-05 NOTE — Telephone Encounter (Signed)
Spoke with patient regarding results and recommendation.  Patient verbalizes understanding and is agreeable to plan of care. Advised patient to call back with any issues or concerns.  

## 2020-12-05 NOTE — Telephone Encounter (Signed)
Follow up:     Patient returning a call back for results.

## 2020-12-05 NOTE — Telephone Encounter (Signed)
-----   Message from Berniece Salines, DO sent at 12/05/2020 11:26 AM EST ----- We will have to continue to monitor hemoglobin is 12.96 months ago was 14.6

## 2021-01-14 ENCOUNTER — Encounter: Payer: Self-pay | Admitting: Cardiology

## 2021-01-14 ENCOUNTER — Ambulatory Visit: Payer: Medicare Other | Admitting: Cardiology

## 2021-01-14 ENCOUNTER — Other Ambulatory Visit: Payer: Self-pay

## 2021-01-14 VITALS — BP 122/60 | HR 66 | Ht 70.0 in | Wt 194.4 lb

## 2021-01-14 DIAGNOSIS — I493 Ventricular premature depolarization: Secondary | ICD-10-CM | POA: Diagnosis not present

## 2021-01-14 MED ORDER — MEXILETINE HCL 250 MG PO CAPS: 250.0000 mg | ORAL_CAPSULE | Freq: Two times a day (BID) | ORAL | 3 refills | Status: DC
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
## 2021-01-14 NOTE — Patient Instructions (Addendum)
Medication Instructions:  Your physician has recommended you make the following change in your medication:  1. STOP Metoprolol (Lopressor)  *If you need a refill on your cardiac medications before your next appointment, please call your pharmacy*   Lab Work: None ordered   Testing/Procedures: None ordered   Follow-Up: At [facility], you and your health needs are our priority.  As part of our continuing mission to provide you with exceptional heart care, we have created designated Provider Care Teams.  These Care Teams include your primary Cardiologist (physician) and Advanced Practice Providers (APPs -  Physician Assistants and Nurse Practitioners) who all work together to provide you with the care you need, when you need it.  We recommend signing up for the patient portal called "MyChart".  Sign up information is provided on this After Visit Summary.  MyChart is used to connect with patients for Virtual Visits (Telemedicine).  Patients are able to view lab/test results, encounter notes, upcoming appointments, etc.  Non-urgent messages can be sent to your provider as well.   To learn more about what you can do with MyChart, go to [email or web address].    Your next appointment:   6 month(s)  The format for your next appointment:   In Person  Provider:   Allegra Lai, MD    Thank you for choosing [facility]!!   Trinidad Curet, RN [phone]

## 2021-01-23 ENCOUNTER — Telehealth: Payer: Self-pay | Admitting: Cardiology

## 2021-01-23 MED ORDER — ATORVASTATIN CALCIUM 40 MG PO TABS: 40.0000 mg | ORAL_TABLET | Freq: Every day | ORAL | 1 refills | Status: DC

## 2021-01-23 NOTE — Telephone Encounter (Signed)
*  STAT* If patient is at the pharmacy, call can be transferred to refill team.   1. Which medications need to be refilled? (please list name of each medication and dose if known) atorvastatin (LIPITOR) 40 MG tablet  2. Which pharmacy/location (including street and city if local pharmacy) is medication to be sent to? [place], [place].  3. Do they need a 30 day or 90 day supply? 90 day supply

## 2021-01-23 NOTE — Telephone Encounter (Signed)
Medication filled.  

## 2021-06-06 ENCOUNTER — Other Ambulatory Visit: Payer: Self-pay

## 2021-06-06 ENCOUNTER — Ambulatory Visit: Payer: Medicare Other | Admitting: Cardiology

## 2021-06-06 ENCOUNTER — Encounter: Payer: Self-pay | Admitting: Cardiology

## 2021-06-06 VITALS — BP 122/52 | HR 56 | Ht 70.0 in | Wt 185.1 lb

## 2021-06-06 DIAGNOSIS — I10 Essential (primary) hypertension: Secondary | ICD-10-CM | POA: Diagnosis not present

## 2021-06-06 DIAGNOSIS — E782 Mixed hyperlipidemia: Secondary | ICD-10-CM | POA: Diagnosis not present

## 2021-06-06 DIAGNOSIS — Z79899 Other long term (current) drug therapy: Secondary | ICD-10-CM

## 2021-06-06 DIAGNOSIS — Z9861 Coronary angioplasty status: Secondary | ICD-10-CM

## 2021-06-06 DIAGNOSIS — I2510 Atherosclerotic heart disease of native coronary artery without angina pectoris: Secondary | ICD-10-CM | POA: Diagnosis not present

## 2021-06-06 DIAGNOSIS — I493 Ventricular premature depolarization: Secondary | ICD-10-CM
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
## 2021-06-06 NOTE — Patient Instructions (Signed)
Medication Instructions:  Your physician has recommended you make the following change in your medication:  STOP: Plavix *If you need a refill on your cardiac medications before your next appointment, please call your pharmacy*   Lab Work: None If you have labs (blood work) drawn today and your tests are completely normal, you will receive your results only by: [place] (if you have MyChart) OR A paper copy in the mail If you have any lab test that is abnormal or we need to change your treatment, we will call you to review the results.   Testing/Procedures: None   Follow-Up: At [facility], you and your health needs are our priority.  As part of our continuing mission to provide you with exceptional heart care, we have created designated Provider Care Teams.  These Care Teams include your primary Cardiologist (physician) and Advanced Practice Providers (APPs -  Physician Assistants and Nurse Practitioners) who all work together to provide you with the care you need, when you need it.  We recommend signing up for the patient portal called "MyChart".  Sign up information is provided on this After Visit Summary.  MyChart is used to connect with patients for Virtual Visits (Telemedicine).  Patients are able to view lab/test results, encounter notes, upcoming appointments, etc.  Non-urgent messages can be sent to your provider as well.   To learn more about what you can do with MyChart, go to [email or web address].    Your next appointment:   1 year(s)  The format for your next appointment:   In Person  Provider:   Berniece [place]ines, DO [address], [place], [place] [address]    Other Instructions

## 2021-07-25 ENCOUNTER — Encounter: Payer: Self-pay | Admitting: Cardiology

## 2021-07-25 ENCOUNTER — Other Ambulatory Visit: Payer: Self-pay

## 2021-07-25 ENCOUNTER — Ambulatory Visit: Payer: Medicare Other | Admitting: Cardiology

## 2021-07-25 VITALS — BP 114/64 | HR 53 | Ht 70.0 in | Wt 181.0 lb

## 2021-07-25 DIAGNOSIS — I493 Ventricular premature depolarization: Secondary | ICD-10-CM

## 2021-07-25 NOTE — Patient Instructions (Signed)
Medication Instructions:  Your physician has recommended you make the following change in your medication:  STOP Mexiletine  *If you need a refill on your cardiac medications before your next appointment, please call your pharmacy*   Lab Work: None ordered   Testing/Procedures: None ordered   Follow-Up: At [facility], you and your health needs are our priority.  As part of our continuing mission to provide you with exceptional heart care, we have created designated Provider Care Teams.  These Care Teams include your primary Cardiologist (physician) and Advanced Practice Providers (APPs -  Physician Assistants and Nurse Practitioners) who all work together to provide you with the care you need, when you need it.  We recommend signing up for the patient portal called "MyChart".  Sign up information is provided on this After Visit Summary.  MyChart is used to connect with patients for Virtual Visits (Telemedicine).  Patients are able to view lab/test results, encounter notes, upcoming appointments, etc.  Non-urgent messages can be sent to your provider as well.   To learn more about what you can do with MyChart, go to [email or web address].    Your next appointment:   1 year(s)  The format for your next appointment:   In Person  Provider:   Allegra Lai, MD    Thank you for choosing [facility]!!   Trinidad Curet, RN [phone]   Other Instructions
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
## 2021-07-30 ENCOUNTER — Other Ambulatory Visit: Payer: Self-pay | Admitting: Cardiology

## 2021-08-08 ENCOUNTER — Other Ambulatory Visit: Payer: Self-pay | Admitting: Cardiology

## 2021-08-09 ENCOUNTER — Other Ambulatory Visit: Payer: Self-pay | Admitting: *Deleted

## 2021-08-09 ENCOUNTER — Other Ambulatory Visit: Payer: Self-pay | Admitting: Cardiology

## 2021-08-09 ENCOUNTER — Other Ambulatory Visit: Payer: Self-pay

## 2021-08-09 MED ORDER — METOPROLOL TARTRATE 25 MG PO TABS: 12.5000 mg | ORAL_TABLET | Freq: Two times a day (BID) | ORAL | 3 refills | Status: DC

## 2021-08-09 NOTE — Telephone Encounter (Signed)
*  STAT* If patient is at the pharmacy, call can be transferred to refill team.   1. Which medications need to be refilled? (please list name of each medication and dose if known) metoprolol tartrate (LOPRESSOR) 25 MG tablet  2. Which pharmacy/location (including street and city if local pharmacy) is medication to be sent to? [place], [place].  3. Do they need a 30 day or 90 day supply? 30 ds

## 2021-08-09 NOTE — Progress Notes (Signed)
Refill sent to pharmacy.   

## 2021-09-05 ENCOUNTER — Other Ambulatory Visit: Payer: Self-pay | Admitting: Cardiology

## 2021-09-26 ENCOUNTER — Other Ambulatory Visit: Payer: Self-pay

## 2021-09-26 MED ORDER — ATORVASTATIN CALCIUM 40 MG PO TABS: 40.0000 mg | ORAL_TABLET | Freq: Every day | ORAL | 1 refills | Status: DC

## 2021-10-01 ENCOUNTER — Telehealth: Payer: Self-pay | Admitting: Cardiology

## 2021-10-01 NOTE — Telephone Encounter (Signed)
New Message:     Patient wants to know if he can start back taking Mexiletine 250 mg? Heart feels like it is racing. Patient c/o Palpitations:  High priority if patient c/o lightheadedness, shortness of breath, or chest pain  How long have you had palpitations/irregular HR/ Afib? Are you having the symptoms now? Heart feels like it is racing- not at this time  Are you currently experiencing lightheadedness, SOB or CP? Little chest pain off and on   Do you have a history of afib (atrial fibrillation) or irregular heart rhythm? yes   Have you checked your BP or HR? (document readings if available): good 124/70  Are you experiencing any other symptoms? No other symptoms

## 2021-10-01 NOTE — Telephone Encounter (Signed)
Spoke with pt who complains of heart racing on Friday 09/27/2021 for about 15 minutes.  HR was 80bpm.  Pt denies shortness of breath or dizziness but reported some mild chest discomfort during episode.  Pt denies current CP, SOB or dizziness and no additional episodes.  He is taking medications as prescribed and reports he is under a great deal of stress due to his wife's current illness.  He would like to know if he should restart Mexiletine. Pt advised will forward to Dr Curt Bears for review and recommendation.  Pt verbalizes understanding and agrees with current plan.

## 2021-10-02 NOTE — Telephone Encounter (Signed)
Left message to call back  

## 2021-10-04 MED ORDER — MEXILETINE HCL 250 MG PO CAPS: 250.0000 mg | ORAL_CAPSULE | Freq: Two times a day (BID) | ORAL | 6 refills | Status: DC

## 2021-10-04 NOTE — Telephone Encounter (Signed)
Pt  advised ok to restart Mexiletine per [place]. Mexiletine 250 mg BID Rx sent to requested pharmacy.

## 2021-10-04 NOTE — Telephone Encounter (Signed)
Patient calling back to speak to [facility]

## 2021-12-21 ENCOUNTER — Other Ambulatory Visit: Payer: Self-pay | Admitting: Cardiology

## 2022-02-17 IMAGING — CR DG CHEST 2V
2 series · 2 of 2 positions shown · non-contrast
Comparison: [facility] chest radiographs 05/19/2012.

CLINICAL DATA: 78-year-old male with left side chest pain, left arm
numbness, abnormal cardiac stress test today.

EXAM:
CHEST - 2 VIEW

[chest pa]
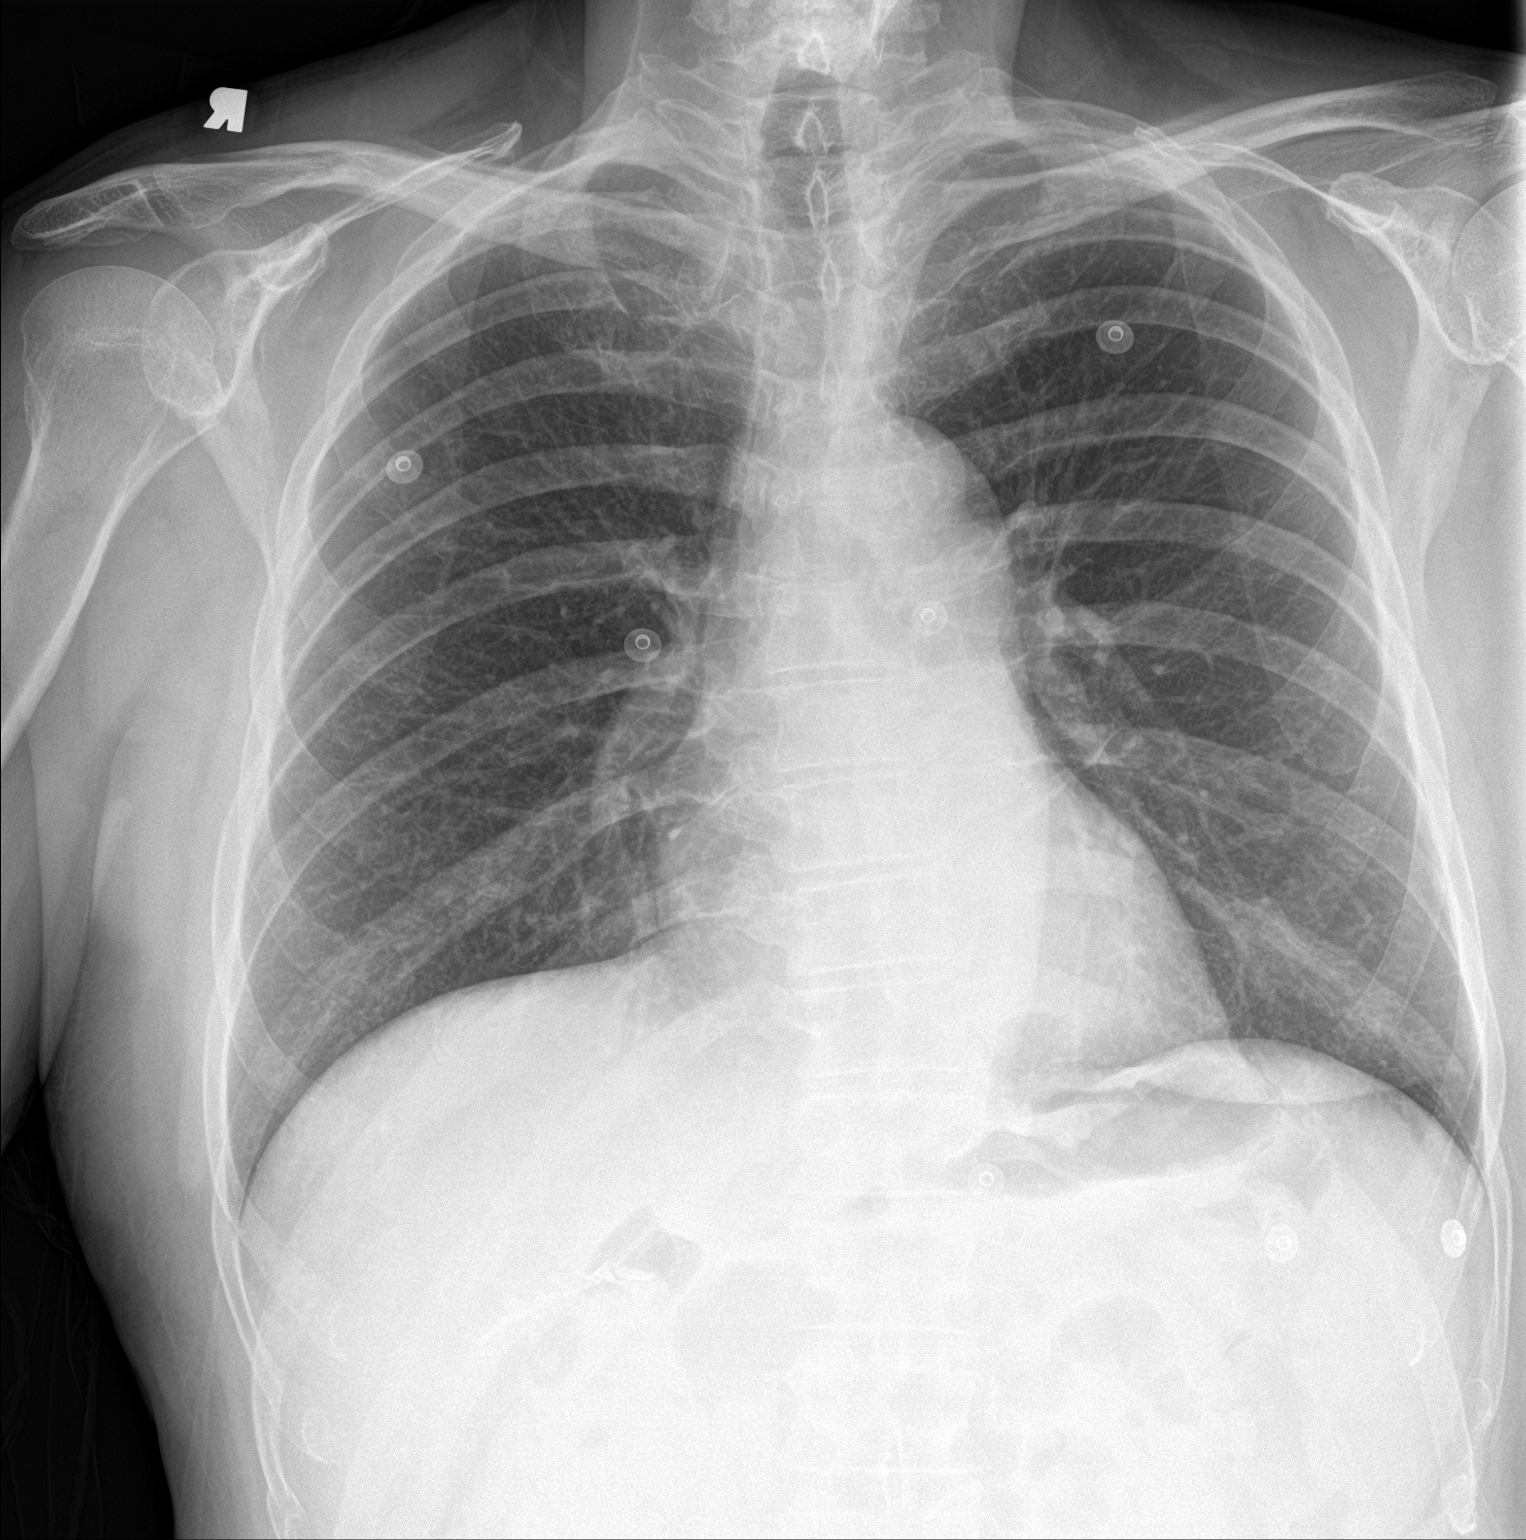

[chest lat]
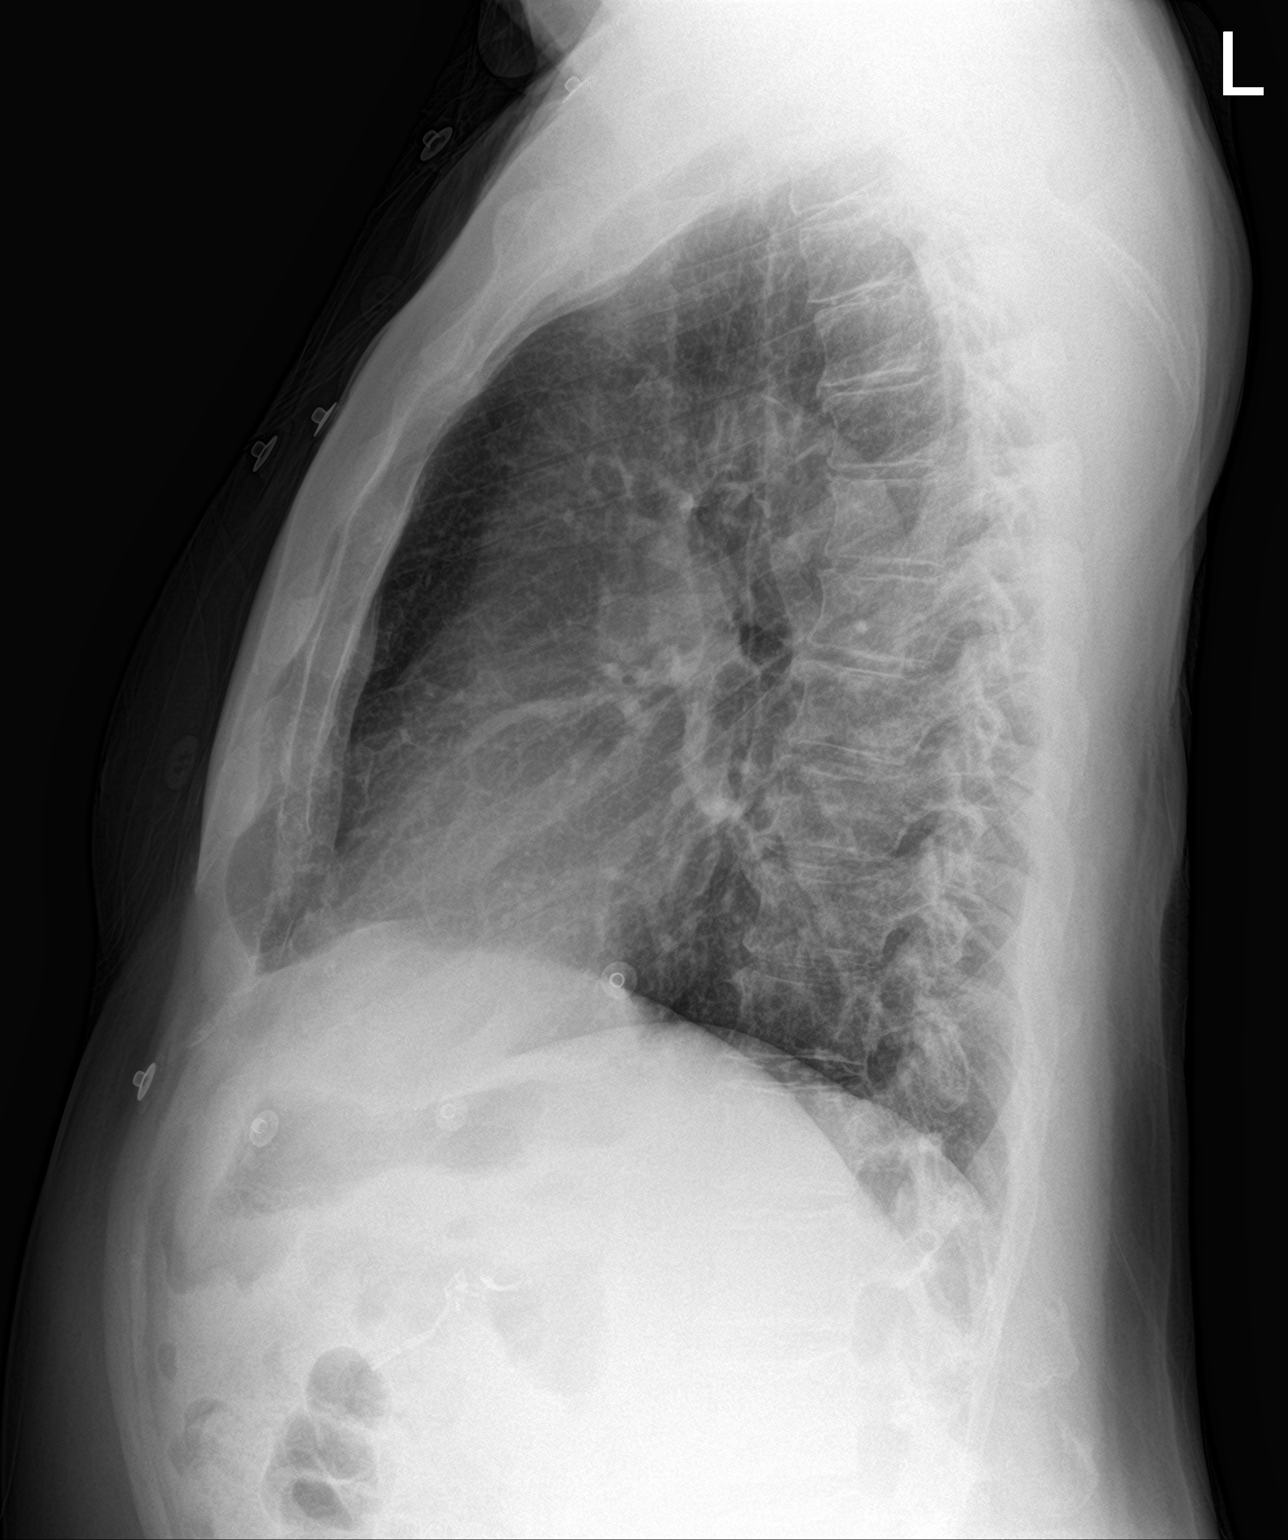

[2 of 2 positions shown; findings below may reference images not displayed]

FINDINGS: Lung volumes and mediastinal contours remain within normal limits.
Visualized tracheal air column is within normal limits. Both lungs
appear stable and clear. No pneumothorax or pleural effusion.

Cholecystectomy clips. Negative visible bowel gas pattern. No acute
osseous abnormality identified.
IMPRESSION: No acute cardiopulmonary abnormality.

## 2022-04-11 ENCOUNTER — Other Ambulatory Visit: Payer: Self-pay

## 2022-04-11 ENCOUNTER — Telehealth: Payer: Self-pay | Admitting: Cardiology

## 2022-04-11 MED ORDER — METOPROLOL TARTRATE 25 MG PO TABS: 12.5000 mg | ORAL_TABLET | Freq: Two times a day (BID) | ORAL | 3 refills | Status: DC

## 2022-04-11 NOTE — Telephone Encounter (Signed)
*  STAT* If patient is at the pharmacy, call can be transferred to refill team.   1. Which medications need to be refilled? (please list name of each medication and dose if known) metoprolol tartrate (LOPRESSOR) 25 MG tablet   2. Which pharmacy/location (including street and city if local pharmacy) is medication to be sent to? [place], [place].  3. Do they need a 30 day or 90 day supply? [place]

## 2022-04-28 ENCOUNTER — Other Ambulatory Visit: Payer: Self-pay

## 2022-04-28 MED ORDER — MEXILETINE HCL 250 MG PO CAPS: 250.0000 mg | ORAL_CAPSULE | Freq: Two times a day (BID) | ORAL | 3 refills | Status: DC

## 2022-07-04 ENCOUNTER — Encounter: Payer: Self-pay | Admitting: Cardiology

## 2022-07-04 ENCOUNTER — Ambulatory Visit: Payer: Medicare Other | Attending: Cardiology | Admitting: Cardiology

## 2022-07-04 VITALS — BP 130/68 | HR 52 | Ht 70.0 in | Wt 200.0 lb

## 2022-07-04 DIAGNOSIS — E782 Mixed hyperlipidemia: Secondary | ICD-10-CM | POA: Diagnosis not present

## 2022-07-04 DIAGNOSIS — I2510 Atherosclerotic heart disease of native coronary artery without angina pectoris: Secondary | ICD-10-CM | POA: Diagnosis not present

## 2022-07-04 DIAGNOSIS — I10 Essential (primary) hypertension: Secondary | ICD-10-CM | POA: Diagnosis not present

## 2022-07-04 DIAGNOSIS — I493 Ventricular premature depolarization: Secondary | ICD-10-CM

## 2022-07-04 DIAGNOSIS — Z9861 Coronary angioplasty status: Secondary | ICD-10-CM

## 2022-07-04 MED ORDER — NITROGLYCERIN 0.4 MG SL SUBL: 0.4000 mg | SUBLINGUAL_TABLET | SUBLINGUAL | 3 refills | Status: DC | PRN
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
## 2022-07-04 NOTE — Patient Instructions (Signed)
Medication Instructions:  Your physician recommends that you continue on your current medications as directed. Please refer to the Current Medication list given to you today.  *If you need a refill on your cardiac medications before your next appointment, please call your pharmacy*   Lab Work: None If you have labs (blood work) drawn today and your tests are completely normal, you will receive your results only by: [place] (if you have MyChart) OR A paper copy in the mail If you have any lab test that is abnormal or we need to change your treatment, we will call you to review the results.   Testing/Procedures: None   Follow-Up: At [facility], you and your health needs are our priority.  As part of our continuing mission to provide you with exceptional heart care, we have created designated Provider Care Teams.  These Care Teams include your primary Cardiologist (physician) and Advanced Practice Providers (APPs -  Physician Assistants and Nurse Practitioners) who all work together to provide you with the care you need, when you need it.  We recommend signing up for the patient portal called "MyChart".  Sign up information is provided on this After Visit Summary.  MyChart is used to connect with patients for Virtual Visits (Telemedicine).  Patients are able to view lab/test results, encounter notes, upcoming appointments, etc.  Non-urgent messages can be sent to your provider as well.   To learn more about what you can do with MyChart, go to [email or web address].    Your next appointment:   9 month(s)  The format for your next appointment:   In Person  Provider:   Berniece Salines, DO     Other Instructions   Important Information About Sugar

## 2022-08-25 ENCOUNTER — Ambulatory Visit: Payer: Medicare Other | Admitting: Cardiology

## 2022-08-28 ENCOUNTER — Encounter: Payer: Self-pay | Admitting: Cardiology

## 2022-08-28 ENCOUNTER — Ambulatory Visit: Payer: Medicare Other | Attending: Cardiology | Admitting: Cardiology

## 2022-08-28 VITALS — BP 116/60 | HR 56 | Ht 70.0 in | Wt 198.0 lb

## 2022-08-28 DIAGNOSIS — I493 Ventricular premature depolarization: Secondary | ICD-10-CM

## 2022-08-28 NOTE — Patient Instructions (Signed)
Medication Instructions:  Your physician has recommended you make the following change in your medication:  STOP Mexiletine  *If you need a refill on your cardiac medications before your next appointment, please call your pharmacy*   Lab Work: None ordered.   Testing/Procedures: None ordered   Follow-Up: At [facility], you and your health needs are our priority.  As part of our continuing mission to provide you with exceptional heart care, we have created designated Provider Care Teams.  These Care Teams include your primary Cardiologist (physician) and Advanced Practice Providers (APPs -  Physician Assistants and Nurse Practitioners) who all work together to provide you with the care you need, when you need it.  Your next appointment:   6 month(s)  The format for your next appointment:   In Person  Provider:   Allegra Lai, MD    Thank you for choosing [facility]!!   Trinidad Curet, RN [phone]  Other Instructions   Important Information About Sugar
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
## 2022-09-29 ENCOUNTER — Other Ambulatory Visit: Payer: Self-pay | Admitting: Cardiology

## 2022-12-27 ENCOUNTER — Other Ambulatory Visit: Payer: Self-pay | Admitting: Cardiology

## 2022-12-29 NOTE — Telephone Encounter (Signed)
Rx refill sent to pharmacy. 

## 2023-01-21 LAB — COMPREHENSIVE METABOLIC PANEL: EGFR: 63

## 2023-03-05 ENCOUNTER — Ambulatory Visit: Payer: Medicare Other | Attending: Cardiology | Admitting: Cardiology

## 2023-03-05 ENCOUNTER — Encounter: Payer: Self-pay | Admitting: Cardiology

## 2023-03-05 VITALS — BP 120/74 | HR 52 | Ht 70.0 in | Wt 216.6 lb

## 2023-03-05 DIAGNOSIS — I2510 Atherosclerotic heart disease of native coronary artery without angina pectoris: Secondary | ICD-10-CM

## 2023-03-05 DIAGNOSIS — E785 Hyperlipidemia, unspecified: Secondary | ICD-10-CM | POA: Diagnosis not present

## 2023-03-05 DIAGNOSIS — I493 Ventricular premature depolarization: Secondary | ICD-10-CM | POA: Diagnosis not present

## 2023-03-05 NOTE — Patient Instructions (Signed)
Medication Instructions:  Your physician recommends that you continue on your current medications as directed. Please refer to the Current Medication list given to you today. *If you need a refill on your cardiac medications before your next appointment, please call your pharmacy*   Follow-Up: At [facility], you and your health needs are our priority.  As part of our continuing mission to provide you with exceptional heart care, we have created designated Provider Care Teams.  These Care Teams include your primary Cardiologist (physician) and Advanced Practice Providers (APPs -  Physician Assistants and Nurse Practitioners) who all work together to provide you with the care you need, when you need it.  We recommend signing up for the patient portal called "MyChart".  Sign up information is provided on this After Visit Summary.  MyChart is used to connect with patients for Virtual Visits (Telemedicine).  Patients are able to view lab/test results, encounter notes, upcoming appointments, etc.  Non-urgent messages can be sent to your provider as well.   To learn more about what you can do with MyChart, go to [email or web address].    Your next appointment:   1 year(s)  Provider:   You will see one of the following Advanced Practice Providers on your designated Care Team:   Renee Ursuy, PA-C Michael "Andy" Tillery, PA-C 
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
## 2023-03-24 ENCOUNTER — Other Ambulatory Visit: Payer: Self-pay | Admitting: Cardiology

## 2023-06-18 ENCOUNTER — Other Ambulatory Visit: Payer: Self-pay | Admitting: Cardiology

## 2023-06-25 ENCOUNTER — Encounter: Payer: Self-pay | Admitting: Cardiology

## 2023-06-25 ENCOUNTER — Ambulatory Visit: Payer: Medicare Other | Attending: Cardiology | Admitting: Cardiology

## 2023-06-25 VITALS — BP 134/66 | HR 55 | Ht 70.0 in | Wt 213.2 lb

## 2023-06-25 DIAGNOSIS — I2510 Atherosclerotic heart disease of native coronary artery without angina pectoris: Secondary | ICD-10-CM | POA: Diagnosis not present

## 2023-06-25 DIAGNOSIS — I10 Essential (primary) hypertension: Secondary | ICD-10-CM | POA: Diagnosis not present

## 2023-06-25 DIAGNOSIS — Z9861 Coronary angioplasty status: Secondary | ICD-10-CM

## 2023-06-25 DIAGNOSIS — I493 Ventricular premature depolarization: Secondary | ICD-10-CM

## 2023-06-25 DIAGNOSIS — R072 Precordial pain: Secondary | ICD-10-CM | POA: Diagnosis not present

## 2023-06-25 DIAGNOSIS — E782 Mixed hyperlipidemia: Secondary | ICD-10-CM

## 2023-06-25 MED ORDER — NITROGLYCERIN 0.4 MG SL SUBL: 0.4000 mg | SUBLINGUAL_TABLET | SUBLINGUAL | 3 refills | Status: DC | PRN

## 2023-06-25 MED ORDER — ATORVASTATIN CALCIUM 40 MG PO TABS: 40.0000 mg | ORAL_TABLET | Freq: Every day | ORAL | 3 refills | Status: DC

## 2023-06-25 MED ORDER — METOPROLOL TARTRATE 25 MG PO TABS: 12.5000 mg | ORAL_TABLET | Freq: Two times a day (BID) | ORAL | 3 refills | Status: DC

## 2023-06-25 NOTE — Patient Instructions (Signed)
Medication Instructions:  Your physician recommends that you continue on your current medications as directed. Please refer to the Current Medication list given to you today.  *If you need a refill on your cardiac medications before your next appointment, please call your pharmacy*   Lab Work: None   Testing/Procedures: None   Follow-Up: At [facility], you and your health needs are our priority.  As part of our continuing mission to provide you with exceptional heart care, we have created designated Provider Care Teams.  These Care Teams include your primary Cardiologist (physician) and Advanced Practice Providers (APPs -  Physician Assistants and Nurse Practitioners) who all work together to provide you with the care you need, when you need it.   Your next appointment:   1 year(s)  Provider:   Thomasene Ripple, DO
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
## 2024-02-29 NOTE — Progress Notes (Signed)
  Electrophysiology Office Note:   Date:  03/01/2024  ID:  JEREMAIH KLIMA, DOB 08/14/1941, MRN [id number]  Primary Cardiologist: Kardie Tobb, DO Electrophysiologist: Will Cortland Ding, MD      History of Present Illness:   Vincent Mcbride is a 83 y.o. male with h/o CAD, HLD, and PVCs seen today for routine electrophysiology followup.   Since last being seen in our clinic the patient reports doing well. Overall, he denies chest pain, palpitations, dyspnea, PND, orthopnea, nausea, vomiting, dizziness, syncope, edema, weight gain, or early satiety.   Review of systems complete and found to be negative unless listed in HPI.   EP Information / Studies Reviewed:    EKG is ordered today. Personal review as below.  EKG Interpretation Date/Time:  Tuesday March 01 2024 11:52:42 EDT Ventricular Rate:  54 PR Interval:  176 QRS Duration:  92 QT Interval:  436 QTC Calculation: 413 R Axis:   57  Text Interpretation: Sinus bradycardia When compared with ECG of 23-May-2020 12:05, No significant change was found Confirmed by Pilar Bridge [phone]) on 03/01/2024 11:58:36 AM    Arrhythmia/Device History No specialty comments available.   Physical Exam:   VS:  BP 118/60   Pulse (!) 54   Ht 5\' 10"  (1.778 m)   Wt 217 lb 9.6 oz (98.7 kg)   SpO2 99%   BMI 31.22 kg/m    Wt Readings from Last 3 Encounters:  03/01/24 217 lb 9.6 oz (98.7 kg)  06/25/23 213 lb 3.2 oz (96.7 kg)  03/05/23 216 lb 9.6 oz (98.2 kg)     GEN: No acute distress NECK: No JVD; No carotid bruits CARDIAC: Regular rate and rhythm, no murmurs, rubs, gallops RESPIRATORY:  Clear to auscultation without rales, wheezing or rhonchi  ABDOMEN: Soft, non-tender, non-distended EXTREMITIES:  No edema; No deformity   ASSESSMENT AND PLAN:    PVCs Previously burden was 6.2% Stable on lopressor  12.5 mg BID Previously taken off mexitil pt preference  CAD No s/s of ischemia.      HLD Per gen cards/PCP  Follow up with EP APP in  12 months  Signed, Tylene Galla, PA-C

## 2024-03-01 ENCOUNTER — Encounter: Payer: Self-pay | Admitting: Student

## 2024-03-01 ENCOUNTER — Ambulatory Visit: Attending: Student | Admitting: Student

## 2024-03-01 VITALS — BP 118/60 | HR 54 | Ht 70.0 in | Wt 217.6 lb

## 2024-03-01 DIAGNOSIS — I2510 Atherosclerotic heart disease of native coronary artery without angina pectoris: Secondary | ICD-10-CM | POA: Diagnosis not present

## 2024-03-01 DIAGNOSIS — I493 Ventricular premature depolarization: Secondary | ICD-10-CM

## 2024-03-01 DIAGNOSIS — Z9861 Coronary angioplasty status: Secondary | ICD-10-CM | POA: Diagnosis not present

## 2024-03-01 DIAGNOSIS — I10 Essential (primary) hypertension: Secondary | ICD-10-CM

## 2024-03-01 NOTE — Patient Instructions (Signed)
 Medication Instructions:  No medication changes today. *If you need a refill on your cardiac medications before your next appointment, please call your pharmacy*  Lab Work: No labwork ordered today. If you have labs (blood work) drawn today and your tests are completely normal, you will receive your results only by: MyChart Message (if you have MyChart) OR A paper copy in the mail If you have any lab test that is abnormal or we need to change your treatment, we will call you to review the results.  Testing/Procedures: No testing ordered today  Follow-Up: At [facility], you and your health needs are our priority.  As part of our continuing mission to provide you with exceptional heart care, our providers are all part of one team.  This team includes your primary Cardiologist (physician) and Advanced Practice Providers or APPs (Physician Assistants and Nurse Practitioners) who all work together to provide you with the care you need, when you need it.  Your next appointment:   12 month(s)  Provider:   You may see Will Cortland Ding, MD or one of the following Advanced Practice Providers on your designated Care Team:   Mertha Abrahams, [place] [address] [place], PA-C Suzann Riddle, NP Creighton Doffing, NP    We recommend signing up for the patient portal called "MyChart".  Sign up information is provided on this After Visit Summary.  MyChart is used to connect with patients for Virtual Visits (Telemedicine).  Patients are able to view lab/test results, encounter notes, upcoming appointments, etc.  Non-urgent messages can be sent to your provider as well.   To learn more about what you can do with MyChart, go to [email or web address].

## 2024-03-23 ENCOUNTER — Telehealth: Payer: Self-pay

## 2024-03-23 NOTE — Telephone Encounter (Signed)
 Patient was recently seen by EP service, Jodie, please review, can you clear the patient for upcoming procedure or do you feel the patient will need a general cardiology visit for cardiac clearance.

## 2024-03-23 NOTE — Telephone Encounter (Signed)
   Pre-operative Risk Assessment    Patient Name: Vincent Mcbride  DOB: [birth year] MRN: [id number]   Date of last office visit: 03/01/24 [name], PA-C Date of next office visit: 06/21/24 DUB HUNTSMAN, DO   Request for Surgical Clearance    Procedure:  LEFT KNEE ARTHROPLASTY  Date of Surgery:  Clearance 04/05/24                                Surgeon:  MAUDE HERALD, MD Surgeon's Group or Practice Name:  [facility] Phone number:  [phone] Fax number:  [phone]   Type of Clearance Requested:   - Medical  - Pharmacy:  Hold Aspirin      Type of Anesthesia:  CHOICE   Additional requests/questions:    SignedLucie DELENA Ku   03/23/2024, 3:25 PM

## 2024-03-24 NOTE — Telephone Encounter (Signed)
   Patient Name: Vincent Mcbride  DOB: Jun 18, 1941 MRN: [id number]  Primary Cardiologist: Kardie Tobb, DO  Chart reviewed as part of pre-operative protocol coverage. Given past medical history and time since last visit, based on ACC/AHA guidelines, Vincent Mcbride is at acceptable risk for the planned procedure without further cardiovascular testing.  Patient was recently seen in clinic on 03/28/2024 by [name], PA, per Ozell no concerns from a cardiac perspective, patient is at low risk to proceed without further workup.  If the patient has new chest pain or shortness of breath prior to surgery they should be reevaluated.   Regarding ASA therapy, we recommend continuation of ASA throughout the perioperative period.  However, if the surgeon feels that cessation of ASA is required in the perioperative period, it may be stopped 5-7 days prior to surgery with a plan to resume it as soon as felt to be feasible from a surgical standpoint in the post-operative period.   I will route this recommendation to the requesting party via Epic fax function and remove from pre-op pool.  Please call with questions.  Yecenia Dalgleish D Heavan Francom, NP 03/24/2024, 10:43 AM

## 2024-03-25 NOTE — Progress Notes (Signed)
 Sent message, via epic in basket, requesting orders in epic from Careers adviser.

## 2024-03-28 NOTE — Patient Instructions (Signed)
 SURGICAL WAITING ROOM VISITATION  Patients having surgery or a procedure may have no more than 2 support people in the waiting area - these visitors may rotate.    Children under the age of 90 must have an adult with them who is not the patient.  Visitors with respiratory illnesses are discouraged from visiting and should remain at home.  If the patient needs to stay at the hospital during part of their recovery, the visitor guidelines for inpatient rooms apply. Pre-op nurse will coordinate an appropriate time for 1 support person to accompany patient in pre-op.  This support person may not rotate.    Please refer to the [facility] website for the visitor guidelines for Inpatients (after your surgery is over and you are in a regular room).       Your procedure is scheduled on:  04/05/2024    Report to [facility] Main Entrance    Report to admitting at  145pm     Call this number if you have problems the morning of surgery [phone]   Do not eat food :After Midnight.   After Midnight you may have the following liquids until _ 1245 pm  DAY OF SURGERY  Water Non-Citrus Juices (without pulp, NO RED-Apple, White grape, White cranberry) Black Coffee (NO MILK/CREAM OR CREAMERS, sugar ok)  Clear Tea (NO MILK/CREAM OR CREAMERS, sugar ok) regular and decaf                             Plain Jell-O (NO RED)                                           Fruit ices (not with fruit pulp, NO RED)                                     Popsicles (NO RED)                                                               Sports drinks like Gatorade (NO RED)                     The day of surgery:  Drink ONE (1) Pre-Surgery Clear Ensure or G2 at   1245 pm  the morning of surgery. Drink in one sitting. Do not sip.  This drink was given to you during your hospital  pre-op appointment visit. Nothing else to drink after completing the  Pre-Surgery Clear Ensure or G2.          If you have  questions, please contact your surgeon's office.       Oral Hygiene is also important to reduce your risk of infection.                                    Remember - BRUSH YOUR TEETH THE MORNING OF SURGERY WITH YOUR REGULAR TOOTHPASTE  DENTURES WILL BE REMOVED PRIOR TO SURGERY PLEASE DO  NOT APPLY Poly grip OR ADHESIVES!!!   Do NOT smoke after Midnight   Stop all vitamins and herbal supplements 7 days before surgery.   Take these medicines the morning of surgery with A SIP OF WATER:  metoprolol    DO NOT TAKE ANY ORAL DIABETIC MEDICATIONS DAY OF YOUR SURGERY  Bring CPAP mask and tubing day of surgery.                              You may not have any metal on your body including hair pins, jewelry, and body piercing             Do not wear make-up, lotions, powders, perfumes/cologne, or deodorant  Do not wear nail polish including gel and S&S, artificial/acrylic nails, or any other type of covering on natural nails including finger and toenails. If you have artificial nails, gel coating, etc. that needs to be removed by a nail salon please have this removed prior to surgery or surgery may need to be canceled/ delayed if the surgeon/ anesthesia feels like they are unable to be safely monitored.   Do not shave  48 hours prior to surgery.               Men may shave face and neck.   Do not bring valuables to the hospital. [facility] IS NOT             RESPONSIBLE   FOR VALUABLES.   Contacts, glasses, dentures or bridgework may not be worn into surgery.   Bring small overnight bag day of surgery.   DO NOT BRING YOUR HOME MEDICATIONS TO THE HOSPITAL. PHARMACY WILL DISPENSE MEDICATIONS LISTED ON YOUR MEDICATION LIST TO YOU DURING YOUR ADMISSION IN THE HOSPITAL!    Patients discharged on the day of surgery will not be allowed to drive home.  Someone NEEDS to stay with you for the first 24 hours after anesthesia.   Special Instructions: Bring a copy of your healthcare power of  attorney and living will documents the day of surgery if you haven't scanned them before.              Please read over the following fact sheets you were given: IF YOU HAVE QUESTIONS ABOUT YOUR PRE-OP INSTRUCTIONS PLEASE CALL [phone]   If you received a COVID test during your pre-op visit  it is requested that you wear a mask when out in public, stay away from anyone that may not be feeling well and notify your surgeon if you develop symptoms. If you test positive for Covid or have been in contact with anyone that has tested positive in the last 10 days please notify you surgeon.    [facility] - Preparing for Surgery Before surgery, you can play an important role.  Because skin is not sterile, your skin needs to be as free of germs as possible.  You can reduce the number of germs on your skin by washing with CHG (chlorahexidine gluconate) soap before surgery.  CHG is an antiseptic cleaner which kills germs and bonds with the skin to continue killing germs even after washing. Please DO NOT use if you have an allergy to CHG or antibacterial soaps.  If your skin becomes reddened/irritated stop using the CHG and inform your nurse when you arrive at Short Stay. Do not shave (including legs and underarms) for at least 48 hours prior to the first CHG shower.  You may shave your face/neck. Please follow these instructions carefully:  1.  Shower with CHG Soap the night before surgery and the  morning of Surgery.  2.  If you choose to wash your hair, wash your hair first as usual with your  normal  shampoo.  3.  After you shampoo, rinse your hair and body thoroughly to remove the  shampoo.                           4.  Use CHG as you would any other liquid soap.  You can apply chg directly  to the skin and wash                       Gently with a scrungie or clean washcloth.  5.  Apply the CHG Soap to your body ONLY FROM THE NECK DOWN.   Do not use on face/ open                           Wound or open  sores. Avoid contact with eyes, ears mouth and genitals (private parts).                       Wash face,  Genitals (private parts) with your normal soap.             6.  Wash thoroughly, paying special attention to the area where your surgery  will be performed.  7.  Thoroughly rinse your body with warm water from the neck down.  8.  DO NOT shower/wash with your normal soap after using and rinsing off  the CHG Soap.                9.  Pat yourself dry with a clean towel.            10.  Wear clean pajamas.            11.  Place clean sheets on your bed the night of your first shower and do not  sleep with pets. Day of Surgery : Do not apply any lotions/deodorants the morning of surgery.  Please wear clean clothes to the hospital/surgery center.  FAILURE TO FOLLOW THESE INSTRUCTIONS MAY RESULT IN THE CANCELLATION OF YOUR SURGERY PATIENT SIGNATURE_________________________________  NURSE SIGNATURE__________________________________  ________________________________________________________________________

## 2024-03-28 NOTE — Progress Notes (Signed)
 Anesthesia Review:  PCP: Cardiologist : Tillery LVO 03/01/24   PPM/ ICD: Device Orders: Rep Notified:  Chest x-ray : EKG : 03/01/24  Monitor- 2021  Echo : 2021  Stress test: 2021  Cardiac Cath :  2021   Activity level:  Sleep Study/ CPAP : Fasting Blood Sugar :      / Checks Blood Sugar -- times a day:    Blood Thinner/ Instructions /Last Dose: ASA / Instructions/ Last Dose :    81 mg aspirin 

## 2024-03-29 ENCOUNTER — Encounter (HOSPITAL_COMMUNITY)
Admission: RE | Admit: 2024-03-29 | Discharge: 2024-03-29 | Disposition: A | Discharge status: Home / Self Care | Source: Ambulatory Visit | Attending: Family Medicine | Admitting: Family Medicine

## 2024-03-29 ENCOUNTER — Encounter (HOSPITAL_COMMUNITY): Payer: Self-pay

## 2024-03-29 ENCOUNTER — Other Ambulatory Visit: Payer: Self-pay

## 2024-03-29 DIAGNOSIS — I110 Hypertensive heart disease with heart failure: Secondary | ICD-10-CM | POA: Diagnosis not present

## 2024-03-29 DIAGNOSIS — I5030 Unspecified diastolic (congestive) heart failure: Secondary | ICD-10-CM | POA: Diagnosis not present

## 2024-03-29 DIAGNOSIS — Z01812 Encounter for preprocedural laboratory examination: Secondary | ICD-10-CM | POA: Diagnosis present

## 2024-03-29 DIAGNOSIS — I2510 Atherosclerotic heart disease of native coronary artery without angina pectoris: Secondary | ICD-10-CM | POA: Diagnosis not present

## 2024-03-29 DIAGNOSIS — I739 Peripheral vascular disease, unspecified: Secondary | ICD-10-CM | POA: Diagnosis not present

## 2024-03-29 DIAGNOSIS — Z955 Presence of coronary angioplasty implant and graft: Secondary | ICD-10-CM | POA: Diagnosis not present
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
## 2024-03-30 ENCOUNTER — Encounter (HOSPITAL_COMMUNITY)
Admission: RE | Admit: 2024-03-30 | Discharge: 2024-03-30 | Disposition: A | Discharge status: Home / Self Care | Source: Ambulatory Visit | Attending: Orthopaedic Surgery | Admitting: Orthopaedic Surgery

## 2024-03-30 VITALS — BP 136/65 | HR 47 | Temp 97.5°F | Resp 16 | Ht 70.0 in | Wt 213.0 lb

## 2024-03-30 DIAGNOSIS — I5030 Unspecified diastolic (congestive) heart failure: Secondary | ICD-10-CM | POA: Insufficient documentation

## 2024-03-30 DIAGNOSIS — Z01812 Encounter for preprocedural laboratory examination: Secondary | ICD-10-CM | POA: Diagnosis not present

## 2024-03-30 DIAGNOSIS — I739 Peripheral vascular disease, unspecified: Secondary | ICD-10-CM | POA: Insufficient documentation

## 2024-03-30 DIAGNOSIS — I2510 Atherosclerotic heart disease of native coronary artery without angina pectoris: Secondary | ICD-10-CM | POA: Insufficient documentation

## 2024-03-30 DIAGNOSIS — Z01818 Encounter for other preprocedural examination: Secondary | ICD-10-CM

## 2024-03-30 DIAGNOSIS — I110 Hypertensive heart disease with heart failure: Secondary | ICD-10-CM | POA: Insufficient documentation

## 2024-03-30 DIAGNOSIS — Z955 Presence of coronary angioplasty implant and graft: Secondary | ICD-10-CM | POA: Insufficient documentation

## 2024-03-30 HISTORY — DX: Peripheral vascular disease, unspecified: I73.9

## 2024-03-30 HISTORY — DX: Personal history of urinary calculi: Z87.442

## 2024-03-30 HISTORY — DX: Unspecified osteoarthritis, unspecified site: M19.90

## 2024-03-30 LAB — BASIC METABOLIC PANEL WITH GFR
Anion gap: 6 (ref 5–15)
BUN: 23 mg/dL (ref 8–23)
CO2: 25 mmol/L (ref 22–32)
Calcium: 9.3 mg/dL (ref 8.9–10.3)
Chloride: 107 mmol/L (ref 98–111)
Creatinine, Ser: 1.18 mg/dL (ref 0.61–1.24)
GFR, Estimated: >60 mL/min (ref >60)
Glucose, Bld: 96 mg/dL (ref 70–99)
Potassium: 4.3 mmol/L (ref 3.5–5.1)
Sodium: 138 mmol/L (ref 135–145)

## 2024-03-30 LAB — CBC
HCT: 39.3 % (ref 39.0–52.0)
Hemoglobin: 12.9 g/dL — ABNORMAL LOW (ref 13.0–17.0)
MCH: 31.8 pg (ref 26.0–34.0)
MCHC: 32.8 g/dL (ref 30.0–36.0)
MCV: 96.8 fL (ref 80.0–100.0)
Platelets: 194 10*3/uL (ref 150–400)
RBC: 4.06 MIL/uL — ABNORMAL LOW (ref 4.22–5.81)
RDW: 13.1 % (ref 11.5–15.5)
WBC: 7 10*3/uL (ref 4.0–10.5)
nRBC: 0 % (ref 0.0–0.2)

## 2024-03-30 NOTE — Progress Notes (Addendum)
 Anesthesia Review:  PCP: Jodie Batch  LOV 01/26/24   Cardiologist : [name] LOV 03/01/24  Clearance kaitlyn West,NP  03/24/24  PPM/ ICD: Device Orders: Rep Notified:  Chest x-ray : EKG : 03/01/24  Echo :2021  Stress test: 2021  Cardiac Cath :  2021  Monitor- 2021   Activity level:  Sleep Study/ CPAP : Fasting Blood Sugar :      / Checks Blood Sugar -- times a day:    Blood Thinner/ Instructions /Last Dose: ASA / Instructions/ Last Dose :    81 mg aspirin     No orders in at time of preop appt.

## 2024-03-31 NOTE — Anesthesia Preprocedure Evaluation (Addendum)
 Anesthesia Evaluation  Patient identified by MRN, date of birth, ID band Patient awake    Reviewed: Allergy & Precautions, NPO status , Patient's Chart, lab work & pertinent test results  Airway Mallampati: II  TM Distance: >3 FB Neck ROM: Full    Dental no notable dental hx.    Pulmonary neg pulmonary ROS   Pulmonary exam normal        Cardiovascular hypertension, Pt. on home beta blockers + CAD, + Cardiac Stents (x 2) and + Peripheral Vascular Disease  Normal cardiovascular exam     Neuro/Psych negative neurological ROS  negative psych ROS   GI/Hepatic negative GI ROS,,,  Endo/Other  negative endocrine ROS    Renal/GU Renal disease     Musculoskeletal negative musculoskeletal ROS (+)    Abdominal  (+) + obese  Peds  Hematology negative hematology ROS (+)   Anesthesia Other Findings LEFT KNEE MEDIAL MENISCUS TEAR AND DEGENERATIVE JOINT DISEASE  Reproductive/Obstetrics                              Anesthesia Physical Anesthesia Plan  ASA: 3  Anesthesia Plan: General and Regional   Post-op Pain Management: Regional block*   Induction: Intravenous  PONV Risk Score and Plan: 2 and Ondansetron , Dexamethasone , Midazolam  and Treatment may vary due to age or medical condition  Airway Management Planned: LMA  Additional Equipment:   Intra-op Plan:   Post-operative Plan: Extubation in OR  Informed Consent: I have reviewed the patients History and Physical, chart, labs and discussed the procedure including the risks, benefits and alternatives for the proposed anesthesia with the patient or authorized representative who has indicated his/her understanding and acceptance.     Dental advisory given  Plan Discussed with: CRNA  Anesthesia Plan Comments: (PAT note from 7/2)         Anesthesia Quick Evaluation
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
## 2024-04-05 ENCOUNTER — Ambulatory Visit (HOSPITAL_COMMUNITY): Payer: Self-pay | Admitting: Medical

## 2024-04-05 ENCOUNTER — Ambulatory Visit (HOSPITAL_COMMUNITY)
Admission: RE | Admit: 2024-04-05 | Discharge: 2024-04-05 | Disposition: A | Discharge status: Home / Self Care | Attending: Orthopaedic Surgery | Admitting: Orthopaedic Surgery

## 2024-04-05 ENCOUNTER — Encounter (HOSPITAL_COMMUNITY): Payer: Self-pay | Admitting: Orthopaedic Surgery

## 2024-04-05 ENCOUNTER — Encounter (HOSPITAL_COMMUNITY)
Admission: RE | Disposition: A | Discharge status: Home / Self Care | Payer: Self-pay | Source: Home / Self Care | Attending: Orthopaedic Surgery

## 2024-04-05 ENCOUNTER — Ambulatory Visit (HOSPITAL_COMMUNITY)

## 2024-04-05 DIAGNOSIS — Z955 Presence of coronary angioplasty implant and graft: Secondary | ICD-10-CM | POA: Insufficient documentation

## 2024-04-05 DIAGNOSIS — I2510 Atherosclerotic heart disease of native coronary artery without angina pectoris: Secondary | ICD-10-CM | POA: Insufficient documentation

## 2024-04-05 DIAGNOSIS — I10 Essential (primary) hypertension: Secondary | ICD-10-CM

## 2024-04-05 DIAGNOSIS — S83242A Other tear of medial meniscus, current injury, left knee, initial encounter: Secondary | ICD-10-CM | POA: Insufficient documentation

## 2024-04-05 DIAGNOSIS — E785 Hyperlipidemia, unspecified: Secondary | ICD-10-CM

## 2024-04-05 DIAGNOSIS — M2242 Chondromalacia patellae, left knee: Secondary | ICD-10-CM | POA: Diagnosis not present

## 2024-04-05 DIAGNOSIS — I739 Peripheral vascular disease, unspecified: Secondary | ICD-10-CM | POA: Diagnosis not present

## 2024-04-05 DIAGNOSIS — M25562 Pain in left knee: Secondary | ICD-10-CM

## 2024-04-05 DIAGNOSIS — Z01818 Encounter for other preprocedural examination: Secondary | ICD-10-CM

## 2024-04-05 HISTORY — PX: KNEE ARTHROSCOPY WITH MEDIAL MENISECTOMY: SHX5651

## 2024-04-05 SURGERY — ARTHROSCOPY, KNEE, WITH MEDIAL MENISCECTOMY
Anesthesia: Regional | Site: Knee | Laterality: Left

## 2024-04-05 MED ORDER — SODIUM CHLORIDE 0.9 % IR SOLN
Status: DC | PRN
  Administered 2024-04-05: 3000 mL

## 2024-04-05 MED ORDER — EPHEDRINE SULFATE-NACL 50-0.9 MG/10ML-% IV SOSY
PREFILLED_SYRINGE | INTRAVENOUS | Status: DC | PRN
  Administered 2024-04-05: 10 mg via INTRAVENOUS
  Administered 2024-04-05: 5 mg via INTRAVENOUS

## 2024-04-05 MED ORDER — ROPIVACAINE HCL 5 MG/ML IJ SOLN
INTRAMUSCULAR | Status: DC | PRN
  Administered 2024-04-05: 30 mL via PERINEURAL

## 2024-04-05 MED ORDER — PHENYLEPHRINE HCL (PRESSORS) 10 MG/ML IV SOLN
INTRAVENOUS | Status: AC
  Filled 2024-04-05: qty 2

## 2024-04-05 MED ORDER — LACTATED RINGERS IV SOLN
INTRAVENOUS | Status: DC
Start: 2024-04-05 — End: 2024-04-05

## 2024-04-05 MED ORDER — PROPOFOL 1000 MG/100ML IV EMUL
INTRAVENOUS | Status: AC
  Filled 2024-04-05: qty 100

## 2024-04-05 MED ORDER — ONDANSETRON HCL 4 MG/2ML IJ SOLN
INTRAMUSCULAR | Status: DC | PRN
Start: 2024-04-05 — End: 2024-04-05
  Administered 2024-04-05: 4 mg via INTRAVENOUS

## 2024-04-05 MED ORDER — PHENYLEPHRINE HCL-NACL 20-0.9 MG/250ML-% IV SOLN
INTRAVENOUS | Status: DC | PRN
  Administered 2024-04-05: 25 ug/min via INTRAVENOUS

## 2024-04-05 MED ORDER — EPHEDRINE 5 MG/ML INJ
INTRAVENOUS | Status: AC
  Filled 2024-04-05: qty 5

## 2024-04-05 MED ORDER — FENTANYL CITRATE PF 50 MCG/ML IJ SOSY
25.0000 ug | PREFILLED_SYRINGE | INTRAMUSCULAR | Status: DC | PRN
  Administered 2024-04-05: 50 ug via INTRAVENOUS

## 2024-04-05 MED ORDER — PROPOFOL 10 MG/ML IV BOLUS
INTRAVENOUS | Status: AC
Start: 2024-04-05 — End: 2024-04-05
  Filled 2024-04-05: qty 20

## 2024-04-05 MED ORDER — CEFAZOLIN SODIUM-DEXTROSE 2-4 GM/100ML-% IV SOLN
2.0000 g | Freq: Once | INTRAVENOUS | Status: AC
  Administered 2024-04-05: 2 g via INTRAVENOUS
  Filled 2024-04-05: qty 100

## 2024-04-05 MED ORDER — ORAL CARE MOUTH RINSE: 15.0000 mL | Freq: Once | OROMUCOSAL | Status: AC

## 2024-04-05 MED ORDER — FENTANYL CITRATE (PF) 100 MCG/2ML IJ SOLN
INTRAMUSCULAR | Status: DC | PRN
  Administered 2024-04-05: 50 ug via INTRAVENOUS

## 2024-04-05 MED ORDER — AMISULPRIDE (ANTIEMETIC) 5 MG/2ML IV SOLN: 10.0000 mg | Freq: Once | INTRAVENOUS | Status: DC | PRN

## 2024-04-05 MED ORDER — LIDOCAINE HCL (CARDIAC) PF 100 MG/5ML IV SOSY
PREFILLED_SYRINGE | INTRAVENOUS | Status: DC | PRN
  Administered 2024-04-05: 40 mg via INTRAVENOUS

## 2024-04-05 MED ORDER — MIDAZOLAM HCL 2 MG/2ML IJ SOLN
INTRAMUSCULAR | Status: AC
  Filled 2024-04-05: qty 2

## 2024-04-05 MED ORDER — DEXAMETHASONE SODIUM PHOSPHATE 10 MG/ML IJ SOLN
INTRAMUSCULAR | Status: AC
  Filled 2024-04-05: qty 1

## 2024-04-05 MED ORDER — EPINEPHRINE PF 1 MG/ML IJ SOLN
INTRAMUSCULAR | Status: AC
  Filled 2024-04-05: qty 1

## 2024-04-05 MED ORDER — ONDANSETRON HCL 4 MG/2ML IJ SOLN: 4.0000 mg | Freq: Once | INTRAMUSCULAR | Status: DC | PRN

## 2024-04-05 MED ORDER — PROPOFOL 500 MG/50ML IV EMUL
INTRAVENOUS | Status: DC | PRN
  Administered 2024-04-05: 75 ug/kg/min via INTRAVENOUS

## 2024-04-05 MED ORDER — ACETAMINOPHEN 10 MG/ML IV SOLN: 1000.0000 mg | Freq: Once | INTRAVENOUS | Status: DC | PRN

## 2024-04-05 MED ORDER — FENTANYL CITRATE PF 50 MCG/ML IJ SOSY
PREFILLED_SYRINGE | INTRAMUSCULAR | Status: AC
  Filled 2024-04-05: qty 1

## 2024-04-05 MED ORDER — LIDOCAINE HCL (PF) 2 % IJ SOLN
INTRAMUSCULAR | Status: AC
  Filled 2024-04-05: qty 5

## 2024-04-05 MED ORDER — EPINEPHRINE 1 MG/ML IJ SOLN
INTRAMUSCULAR | Status: DC | PRN
Start: 2024-04-05 — End: 2024-04-05
  Administered 2024-04-05: 1 mg

## 2024-04-05 MED ORDER — DEXAMETHASONE SODIUM PHOSPHATE 10 MG/ML IJ SOLN
INTRAMUSCULAR | Status: DC | PRN
  Administered 2024-04-05: 8 mg via INTRAVENOUS

## 2024-04-05 MED ORDER — BUPIVACAINE-EPINEPHRINE (PF) 0.5% -1:200000 IJ SOLN
INTRAMUSCULAR | Status: AC
Start: 2024-04-05 — End: 2024-04-05
  Filled 2024-04-05: qty 30

## 2024-04-05 MED ORDER — FENTANYL CITRATE (PF) 100 MCG/2ML IJ SOLN
INTRAMUSCULAR | Status: AC
  Filled 2024-04-05: qty 2

## 2024-04-05 MED ORDER — ONDANSETRON HCL 4 MG/2ML IJ SOLN
INTRAMUSCULAR | Status: AC
  Filled 2024-04-05: qty 2

## 2024-04-05 MED ORDER — FENTANYL CITRATE PF 50 MCG/ML IJ SOSY
50.0000 ug | PREFILLED_SYRINGE | Freq: Once | INTRAMUSCULAR | Status: AC
  Administered 2024-04-05: 50 ug via INTRAVENOUS
  Filled 2024-04-05: qty 2

## 2024-04-05 MED ORDER — CHLORHEXIDINE GLUCONATE 0.12 % MT SOLN
15.0000 mL | Freq: Once | OROMUCOSAL | Status: AC
  Administered 2024-04-05: 15 mL via OROMUCOSAL

## 2024-04-05 MED ORDER — PROPOFOL 10 MG/ML IV BOLUS
INTRAVENOUS | Status: DC | PRN
  Administered 2024-04-05: 200 mg via INTRAVENOUS

## 2024-04-05 SURGICAL SUPPLY — 33 items
BAG COUNTER SPONGE SURGICOUNT (BAG) IMPLANT
BLADE EXCALIBUR 4.0X13 (MISCELLANEOUS) IMPLANT
BNDG ELASTIC 6INX 5YD STR LF (GAUZE/BANDAGES/DRESSINGS) ×1 IMPLANT
BNDG ELASTIC 6X10 VLCR STRL LF (GAUZE/BANDAGES/DRESSINGS) IMPLANT
BNDG GAUZE DERMACEA FLUFF 4 (GAUZE/BANDAGES/DRESSINGS) ×1 IMPLANT
DISSECTOR 3.8MM X 13CM (MISCELLANEOUS) ×1 IMPLANT
DRAPE SHEET LG 3/4 BI-LAMINATE (DRAPES) ×1 IMPLANT
DRAPE U-SHAPE 47X51 STRL (DRAPES) ×1 IMPLANT
DRSG ADAPTIC 3X8 NADH LF (GAUZE/BANDAGES/DRESSINGS) IMPLANT
DRSG EMULSION OIL 3X3 NADH (GAUZE/BANDAGES/DRESSINGS) ×1 IMPLANT
DURAPREP 26ML APPLICATOR (WOUND CARE) ×2 IMPLANT
ELECT MENISCUS 165MM 90D (ELECTRODE) IMPLANT
ELECT REM PT RETURN 15FT ADLT (MISCELLANEOUS) IMPLANT
GAUZE PAD ABD 8X10 STRL (GAUZE/BANDAGES/DRESSINGS) IMPLANT
GAUZE SPONGE 4X4 12PLY STRL (GAUZE/BANDAGES/DRESSINGS) ×1 IMPLANT
GLOVE BIO SURGEON STRL SZ8 (GLOVE) ×2 IMPLANT
GLOVE BIOGEL PI IND STRL 7.0 (GLOVE) ×1 IMPLANT
GLOVE BIOGEL PI IND STRL 8 (GLOVE) ×2 IMPLANT
GLOVE SURG SYN 7.0 (GLOVE) ×1 IMPLANT
GLOVE SURG SYN 7.0 PF PI (GLOVE) ×1 IMPLANT
GOWN SRG XL LVL 4 BRTHBL STRL (GOWNS) ×1 IMPLANT
GOWN STRL REUS W/ TWL XL LVL3 (GOWN DISPOSABLE) ×2 IMPLANT
IV NS IRRIG 3000ML ARTHROMATIC (IV SOLUTION) IMPLANT
KIT BASIN OR (CUSTOM PROCEDURE TRAY) ×1 IMPLANT
KIT TURNOVER KIT A (KITS) ×1 IMPLANT
MANIFOLD NEPTUNE II (INSTRUMENTS) IMPLANT
PACK ARTHROSCOPY WL (CUSTOM PROCEDURE TRAY) ×1 IMPLANT
PENCIL SMOKE EVACUATOR (MISCELLANEOUS) IMPLANT
TOWEL OR 17X26 10 PK STRL BLUE (TOWEL DISPOSABLE) ×1 IMPLANT
TUBING ARTHROSCOPY IRRIG 16FT (MISCELLANEOUS) ×1 IMPLANT
WAND ABLATOR APOLLO I90 (BUR) IMPLANT
WATER STERILE IRR 1000ML POUR (IV SOLUTION) ×1 IMPLANT
WRAP KNEE MAXI GEL POST OP (GAUZE/BANDAGES/DRESSINGS) ×1 IMPLANT

## 2024-04-05 NOTE — Op Note (Unsigned)
 Vincent Mcbride, Vincent Mcbride MEDICAL RECORD NO: [id number] ACCOUNT NO: [id number] DATE OF BIRTH: [birth year] FACILITY: [facility] LOCATION: [facility] PHYSICIAN: Maude MATSU. Jennifer Holland, MD  Operative Report   DATE OF PROCEDURE: 04/05/2024  PREOPERATIVE DIAGNOSES: 1.  Left knee torn medial meniscus. 2.  Left knee chondromalacia patella.  POSTOPERATIVE DIAGNOSES: 1.  Left knee torn medial meniscus. 2.  Left knee chondromalacia patella.  PROCEDURES: 1.  Left knee partial medial meniscectomy. 2.  Left knee abrasion arthroplasty, patellofemoral.  ANESTHESIA:  General.  ATTENDING SURGEON:  Maude MATSU. Eeva Schlosser, MD  ASSISTANT:  Prentice Kenderick, PA  INDICATION FOR PROCEDURE:  The patient is an 83 year old man with a long history of left knee pain.  This has persisted despite various conservative measures.  By MRI scan, he has some mild degenerative change and a torn medial meniscus.  He is offered  an arthroscopy.  Informed operative consent was obtained after discussion of possible complications including reaction to anesthesia and infection.  SUMMARY OF FINDINGS AND PROCEDURE:  Under general anesthesia, arthroscopy of the left knee was performed.  Suprapatellar pouch was benign while the patellofemoral joint exhibited some grade 3 and focal grade 4 change on the undersurface of the patella.   An abrasion to bleeding bone was performed in some small areas.  In the medial compartment, he had a radial tear of the posterior horn of the medial meniscus, addressed with about a 5% partial medial meniscectomy contouring this back to stable  structures. He had some grade 2 and 3 change in this compartment and a brief chondroplasty was done here.  ACL looked normal and the lateral compartment was relatively benign.  He was scheduled to go home same day.  DESCRIPTION OF PROCEDURE:  The patient was taken to the operating suite where general anesthesia was applied without difficulty.  He was positioned supine and prepped and  draped in normal sterile fashion.  After the administration of preop IV Kefzol , an  appropriate timeout and arthroscopy of the left knee was performed through a total of two portals.  Findings were as noted above.  The procedure consisted mostly of the partial medial meniscectomy done with basket and shaver back to stable tissues.  He  also had the abrasion done patellofemoral in an area about 10 x 5 mm in size.  The knee was thoroughly irrigated followed by placement of Adaptic over portals and dry gauze followed by a loose Ace wrap.  Estimated blood loss and intraoperative fluid were  obtained from anesthesia records.   DISPOSITION:  The patient was extubated in the operating room and taken to the recovery room in stable condition.  Plans were for him to go home the same day and follow up in the office in less than a week.  I will contact him by phone tonight.   VAI D: 04/05/2024 4:33:57 pm T: 04/05/2024 11:14:00 pm  JOB: [id number]/ [id number]

## 2024-04-05 NOTE — Anesthesia Postprocedure Evaluation (Signed)
 Anesthesia Post Note  Patient: Vincent Mcbride  Procedure(s) Performed: ARTHROSCOPY, KNEE, WITH MEDIAL MENISCECTOMY AND CHONDROPLASTY (Left: Knee)     Patient location during evaluation: PACU Anesthesia Type: Regional and General Level of consciousness: awake Pain management: pain level controlled Vital Signs Assessment: post-procedure vital signs reviewed and stable Respiratory status: spontaneous breathing, nonlabored ventilation and respiratory function stable Cardiovascular status: blood pressure returned to baseline and stable Postop Assessment: no apparent nausea or vomiting Anesthetic complications: no   No notable events documented.  Last Vitals:  Vitals:   04/05/24 1730 04/05/24 1745  BP: (!) 143/67 (!) 154/70  Pulse: (!) 49 (!) 50  Resp: 14 12  Temp:  (!) 36.2 C  SpO2: 100% 100%    Last Pain:  Vitals:   04/05/24 1745  TempSrc:   PainSc: 2                  [name] P Marjan Rosman

## 2024-04-05 NOTE — Transfer of Care (Signed)
 Immediate Anesthesia Transfer of Care Note  Patient: Vincent Mcbride  Procedure(s) Performed: ARTHROSCOPY, KNEE, WITH MEDIAL MENISCECTOMY AND CHONDROPLASTY (Left: Knee)  Patient Location: PACU  Anesthesia Type:General  Level of Consciousness: awake, alert , oriented, and patient cooperative  Airway & Oxygen Therapy: Patient Spontanous Breathing  Post-op Assessment: Report given to RN and Post -op Vital signs reviewed and stable  Post vital signs: Reviewed and stable  Last Vitals:  Vitals Value Taken Time  BP 132/53 04/05/24 17:11  Temp 97   Pulse 51 04/05/24 17:14  Resp 11 04/05/24 17:14  SpO2 100 % 04/05/24 17:14  Vitals shown include unfiled device data.  Last Pain:  Vitals:   04/05/24 1457  TempSrc:   PainSc: 0-No pain         Complications: No notable events documented.

## 2024-04-05 NOTE — Anesthesia Procedure Notes (Signed)
 Anesthesia Regional Block: Adductor canal block   Pre-Anesthetic Checklist: , timeout performed,  Correct Patient, Correct Site, Correct Laterality,  Correct Procedure,, site marked,  Risks and benefits discussed,  Surgical consent,  Pre-op evaluation,  At surgeon's request and post-op pain management  Laterality: Left  Prep: chloraprep       Needles:  Injection technique: Single-shot  Needle Type: Echogenic Stimulator Needle     Needle Length: 9cm  Needle Gauge: 21     Additional Needles:   Procedures:,,,, ultrasound used (permanent image in chart),,    Narrative:  Start time: 04/05/2024 2:45 PM End time: 04/05/2024 2:55 PM Injection made incrementally with aspirations every 5 mL.  Performed by: Personally  Anesthesiologist: Patrisha Bernardino SQUIBB, MD  Additional Notes: Functioning IV was confirmed and monitors were applied. A time-out was performed. Hand hygiene and sterile gloves were used. The thigh was placed in a frog-leg position and prepped in a sterile fashion. A 90mm 21ga Arrow echogenic stimulator needle was placed using ultrasound guidance.  Negative aspiration and negative test dose prior to incremental administration of local anesthetic. The patient tolerated the procedure well.

## 2024-04-05 NOTE — Brief Op Note (Signed)
 SHUBHAM THACKSTON [id number] 04/05/2024   PRE-OP DIAGNOSIS: left knee TMM and CP  POST-OP DIAGNOSIS: same  PROCEDURE: left knee scope  ANESTHESIA: general  Maude KANDICE Herald   Dictation #:  [id number]

## 2024-04-05 NOTE — Interval H&P Note (Signed)
 History and Physical Interval Note:  04/05/2024 2:59 PM  Vincent Mcbride  has presented today for surgery, with the diagnosis of LEFT KNEE MEDIAL MENISCUS TEAR AND DEGENERATIVE JOINT DISEASE.  The various methods of treatment have been discussed with the patient and family. After consideration of risks, benefits and other options for treatment, the patient has consented to  Procedure(s) with comments: ARTHROSCOPY, KNEE, WITH MEDIAL MENISCECTOMY (Left) - LEFT KNEE ARTHROPLASTY as a surgical intervention.  The patient's history has been reviewed, patient examined, no change in status, stable for surgery.  I have reviewed the patient's chart and labs.  Questions were answered to the patient's satisfaction.     [name] G Betsabe Iglesia

## 2024-04-05 NOTE — Anesthesia Procedure Notes (Signed)
 Procedure Name: LMA Insertion Date/Time: 04/05/2024 4:03 PM  Performed by: Nada Corean CROME, CRNAPre-anesthesia Checklist: Emergency Drugs available, Patient identified, Suction available, Patient being monitored and Timeout performed Patient Re-evaluated:Patient Re-evaluated prior to induction Oxygen Delivery Method: Circle system utilized Preoxygenation: Pre-oxygenation with 100% oxygen Induction Type: IV induction Ventilation: Mask ventilation without difficulty LMA: LMA inserted LMA Size: 5.0 Number of attempts: 2 Placement Confirmation: positive ETCO2 and breath sounds checked- equal and bilateral Tube secured with: Tape Dental Injury: Teeth and Oropharynx as per pre-operative assessment

## 2024-04-06 ENCOUNTER — Encounter (HOSPITAL_COMMUNITY): Payer: Self-pay | Admitting: Orthopaedic Surgery

## 2024-06-21 ENCOUNTER — Ambulatory Visit: Admitting: Cardiology

## 2024-07-27 ENCOUNTER — Encounter: Payer: Self-pay | Admitting: Cardiology

## 2024-07-27 ENCOUNTER — Ambulatory Visit: Attending: Cardiology | Admitting: Cardiology

## 2024-07-27 VITALS — BP 120/60 | HR 71 | Ht 70.0 in | Wt 215.2 lb

## 2024-07-27 DIAGNOSIS — I493 Ventricular premature depolarization: Secondary | ICD-10-CM

## 2024-07-27 DIAGNOSIS — E785 Hyperlipidemia, unspecified: Secondary | ICD-10-CM | POA: Diagnosis not present

## 2024-07-27 DIAGNOSIS — Z9861 Coronary angioplasty status: Secondary | ICD-10-CM | POA: Diagnosis not present

## 2024-07-27 DIAGNOSIS — I2510 Atherosclerotic heart disease of native coronary artery without angina pectoris: Secondary | ICD-10-CM

## 2024-07-27 MED ORDER — ATORVASTATIN CALCIUM 40 MG PO TABS: 40.0000 mg | ORAL_TABLET | Freq: Every day | ORAL | 3 refills | Status: AC

## 2024-07-27 MED ORDER — METOPROLOL TARTRATE 25 MG PO TABS: 12.5000 mg | ORAL_TABLET | Freq: Two times a day (BID) | ORAL | 3 refills | Status: AC

## 2024-07-27 NOTE — Patient Instructions (Signed)
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
## 2025-03-02 ENCOUNTER — Ambulatory Visit: Admitting: Cardiology
